# Patient Record
Sex: Female | Born: 1985 | Race: White | Hispanic: No | Marital: Married | Smoking: Never smoker
Health system: Southern US, Community
[De-identification: ages and names within clinical notes are randomized; demographics above are authoritative.]

## PROBLEM LIST (undated history)

## (undated) DIAGNOSIS — R011 Cardiac murmur, unspecified: Secondary | ICD-10-CM

## (undated) DIAGNOSIS — M940 Chondrocostal junction syndrome [Tietze]: Secondary | ICD-10-CM

## (undated) DIAGNOSIS — E739 Lactose intolerance, unspecified: Secondary | ICD-10-CM

## (undated) DIAGNOSIS — B019 Varicella without complication: Secondary | ICD-10-CM

## (undated) DIAGNOSIS — M810 Age-related osteoporosis without current pathological fracture: Secondary | ICD-10-CM

## (undated) DIAGNOSIS — S32000S Wedge compression fracture of unspecified lumbar vertebra, sequela: Secondary | ICD-10-CM

## (undated) DIAGNOSIS — M533 Sacrococcygeal disorders, not elsewhere classified: Secondary | ICD-10-CM

## (undated) DIAGNOSIS — L409 Psoriasis, unspecified: Secondary | ICD-10-CM

## (undated) HISTORY — DX: Chondrocostal junction syndrome (tietze): M94.0

## (undated) HISTORY — DX: Sacrococcygeal disorders, not elsewhere classified: M53.3

## (undated) HISTORY — DX: Cardiac murmur, unspecified: R01.1

## (undated) HISTORY — DX: Lactose intolerance, unspecified: E73.9

## (undated) HISTORY — PX: KNEE ARTHROSCOPY: SHX127

## (undated) HISTORY — DX: Wedge compression fracture of unspecified lumbar vertebra, sequela: S32.000S

## (undated) HISTORY — DX: Varicella without complication: B01.9

## (undated) HISTORY — DX: Psoriasis, unspecified: L40.9

## (undated) HISTORY — DX: Age-related osteoporosis without current pathological fracture: M81.0

---
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
## 2017-04-19 ENCOUNTER — Encounter: Payer: Self-pay | Admitting: Obstetrics and Gynecology

## 2017-05-10 LAB — TSH: TSH: 1.43 (ref 0.41–5.90)

## 2017-05-10 LAB — LIPID PANEL
Cholesterol: 160 (ref 0–200)
HDL: 65 (ref 35–70)
LDL Cholesterol: 82
Triglycerides: 60 (ref 40–160)

## 2017-05-10 LAB — BASIC METABOLIC PANEL
BUN: 5 (ref 4–21)
Creatinine: 0.7 (ref 0.5–1.1)
Glucose: 87
Potassium: 3.5 (ref 3.4–5.3)
Sodium: 140 (ref 137–147)

## 2017-05-10 LAB — HEPATIC FUNCTION PANEL
ALT: 14 (ref 7–35)
AST: 13 (ref 13–35)

## 2017-05-10 LAB — VITAMIN D 25 HYDROXY (VIT D DEFICIENCY, FRACTURES): Vit D, 25-Hydroxy: 27.7

## 2017-06-29 ENCOUNTER — Encounter: Payer: Self-pay | Admitting: *Deleted

## 2017-07-05 LAB — HM DEXA SCAN

## 2017-07-10 NOTE — Progress Notes (Signed)
[name] [place], [place] [address] Phone: [phone] Subjective:     CC: back pain  [id number]  Allison [name] is a 31 y.o. female coming in with complaint of back pain. On 04/04/2017 patient unfortunately was wake boarding and had a audible pop. At that time significant pain and some paresthesias down her legs. Found to have an L3 compression fracture. This is confirmed by CT scan.these were independently visualized by me.patient for the last 2 months have been undergoing formal physical therapy.She had her pelvis realigned which helped alleviate her pain. She is also doing muscle energy for sacral torsion which has alleviated her pain as well. Vertebrae body height has increased from 26 to 29 based on an appointment with a neurosurgeon today.  Patient was released.  Onset- May 2018 Location- right sacral region Duration- intermittent  Character- Aching sensation with significant tightness Aggravating factors- Reliving factors- physical therapy Therapies tried-  Severity-   most recent imaging includes an MRI of the spine taken 05/20/2017. Shows the patient's compression fracture is stabilized with no further progression.  No past medical history on file. No past surgical history on file. Social History   Social History  . Marital status: Married    Spouse name: N/A  . Number of children: N/A  . Years of education: N/A   Social History Main Topics  . Smoking status: Not on file  . Smokeless tobacco: Not on file  . Alcohol use Not on file  . Drug use: Unknown  . Sexual activity: Not on file   Other Topics Concern  . Not on file   Social History Narrative  . No narrative on file   Allergies not on file No family history on file. No family history of autoimmune diseases   Past medical history, social, surgical and family history all reviewed in electronic medical record.  No pertanent information unless stated  regarding to the chief complaint.   Review of Systems:Review of systems updated and as accurate as of 07/12/17  No headache, visual changes, nausea, vomiting, diarrhea, constipation, dizziness, abdominal pain, skin rash, fevers, chills, night sweats, weight loss, swollen lymph nodes, body aches, joint swelling,  chest pain, shortness of breath, mood changes. Positive muscle soreness  Objective  Blood pressure 112/78, pulse 72, weight 120 lb (54.4 kg). Systems examined below as of 07/12/17   General: No apparent distress alert and oriented x3 mood and affect normal, dressed appropriately.  HEENT: Pupils equal, extraocular movements intact  Respiratory: Patient's speak in full sentences and does not appear short of breath  Cardiovascular: No lower extremity edema, non tender, no erythema  Skin: Warm dry intact with no signs of infection or rash on extremities or on axial skeleton.  Abdomen: Soft nontender  Neuro: Cranial nerves II through XII are intact, neurovascularly intact in all extremities with 2+ DTRs and 2+ pulses.  Lymph: No lymphadenopathy of posterior or anterior cervical chain or axillae bilaterally.  Gait normal with good balance and coordination.  MSK:  Non tender with full range of motion and good stability and symmetric strength and tone of shoulders, elbows, wrist, hip, knee and ankles bilaterally.  Back Exam:  Inspection: Loss of lordosis Motion: Flexion 30 deg, Extension 25 deg, Side Bending to 35 deg bilaterally,  Rotation to 35 deg bilaterally  SLR laying: Negative  XSLR laying: Negative  Palpable tenderness: Tenderness over the sacroiliac joint bilaterally as well as the pubic symphysis severely. Mild tightness and pain  in the. FABER: Tightness bilaterally. Sensory change: Gross sensation intact to all lumbar and sacral dermatomes.  Reflexes: 2+ at both patellar tendons, 2+ at achilles tendons, Babinski's downgoing.  Strength at foot  Plantar-flexion: 5/5  Dorsi-flexion: 5/5 Eversion: 5/5 Inversion: 5/5  Leg strength  Quad: 5/5 Hamstring: 5/5 Hip flexor: 5/5 Hip abductors: 5/5  Gait unremarkable.  Osteopathic findings C2 flexed rotated and side bent right C7 flexed rotated and side bent left T3 extended rotated and side bent left inhaled third rib T9 extended rotated and side bent left L3 flexed rotated and side bent right Sacrum right on right Pelvic shear noted  Procedure note 12751; 15 additional minutes spent for Therapeutic exercises as stated in above notes.  This included exercises focusing on stretching, strengthening, with significant focus on eccentric aspects.   Long term goals include an improvement in range of motion, strength, endurance as well as avoiding reinjury. Patient's frequency would include in 1-2 times a day, 3-5 times a week for a duration of 6-12 weeks. Sacroiliac Joint Mobilization and Rehab 1. Work on pretzel stretching, shoulder back and leg draped in front. 3-5 sets, 30 sec.. 2. hip abductor rotations. standing, hip flexion and rotation outward then inward. 3 sets, 15 reps. when can do comfortably, add ankle weights starting at 2 pounds.  3. cross over stretching - shoulder back to ground, same side leg crossover. 3-5 sets for 30 min..  4. rolling up and back knees to chest and rocking. 5. sacral tilt - 5 sets, hold for 5-10 seconds  Proper technique shown and discussed handout in great detail with ATC.  All questions were discussed and answered.     Impression and Recommendations:     This case required medical decision making of moderate complexity.      Note: This dictation was prepared with Dragon dictation along with smaller phrase technology. Any transcriptional errors that result from this process are unintentional.

## 2017-07-12 ENCOUNTER — Ambulatory Visit (INDEPENDENT_AMBULATORY_CARE_PROVIDER_SITE_OTHER): Payer: 59 | Admitting: Family Medicine

## 2017-07-12 ENCOUNTER — Encounter: Payer: Self-pay | Admitting: Family Medicine

## 2017-07-12 DIAGNOSIS — M533 Sacrococcygeal disorders, not elsewhere classified: Secondary | ICD-10-CM

## 2017-07-12 DIAGNOSIS — M999 Biomechanical lesion, unspecified: Secondary | ICD-10-CM | POA: Diagnosis not present

## 2017-07-12 HISTORY — DX: Sacrococcygeal disorders, not elsewhere classified: M53.3

## 2017-07-12 MED ORDER — DICLOFENAC SODIUM 2 % TD SOLN: 2.0000 "application " | Freq: Two times a day (BID) | TRANSDERMAL | 3 refills | Status: DC

## 2017-07-12 MED ORDER — VITAMIN D (ERGOCALCIFEROL) 1.25 MG (50000 UNIT) PO CAPS: 50000.0000 [IU] | ORAL_CAPSULE | ORAL | 0 refills | Status: DC

## 2017-07-12 MED ORDER — PREDNISONE 50 MG PO TABS: 50.0000 mg | ORAL_TABLET | Freq: Every day | ORAL | 0 refills | Status: DC

## 2017-07-12 NOTE — Assessment & Plan Note (Signed)
Decision today to treat with OMT was based on Physical Exam  After verbal consent patient was treated with HVLA, ME, FPR techniques in cervical, thoracic, lumbar and sacral and pelvis areas  Patient tolerated the procedure well with improvement in symptoms  Patient given exercises, stretches and lifestyle modifications  See medications in patient instructions if given  Patient will follow up in 3 weeks

## 2017-07-12 NOTE — Patient Instructions (Addendum)
Good to see you  Ice 20 minutes 2 times daily. Usually after activity and before bed on the sacral area or pubic bone Once weekly vitamin D for 12 weeks.  Prednisone daily for 5 days if you feel not making significant improvement this week Exercises 3 times a week.  Take handout to PT as well.  pennsaid pinkie amount topically 2 times daily as needed.   It will take some time but I will work as hard as you  Over the counter Turmeric 500mg  twice daily  Tart cherry extract any dose at night  See me again in 3 weeks.

## 2017-07-12 NOTE — Assessment & Plan Note (Signed)
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 Patient also has significant inflammation of the pubic synthesis in the SI joints bilaterally. Laboratory workup if this continues. We discussed the possibility of prednisone which patient will consider. Started on once weekly vitamin D as well as prednisone. We discussed which activities doing which ones to avoid. She'll start to increase activity as swelling. Patient will follow-up with me again in 3-4 weeks. Responded fairly well to osteopathic manipulation today.

## 2017-07-23 ENCOUNTER — Encounter: Payer: Self-pay | Admitting: Family Medicine

## 2017-07-25 ENCOUNTER — Encounter: Payer: Self-pay | Admitting: Family Medicine

## 2017-07-26 NOTE — Progress Notes (Signed)
[name] [address], [place] [address] Phone: [phone] Subjective:    I'm seeing this patient by the request  of:    CC: Low back pain follow-up  [id number]  Allison [name] is a 31 y.o. female coming in with complaint of low back pain. Patient was seen by me and did have a history of a compression fracture of the back. Was having more pain over the sacroiliac joint. Since of the pain is severe. Attempted osteopathic manipulation and patient was also given prednisone. Patient was having significant pain still and is here early.  Patient states that she did not get any significant improvement from the osteopathic manipulation. Feels that the tightness in the back is worsening. Most the pain seems to be on the flank of the right side now. Seems to be a constant dull aching sensation that is worse with certain movements. Mild intermittent pain going down the right leg. Patient states that she has even noticed when she lifts her left leg she has more pain going down the right leg. Patient denies any bowel or bladder difficulties but sometimes feels like she is having more urinary urgency recently. Patient is concerned because any other treatments that she has tried at this point has not been helping.   previous imaging did show a subacute L3 and superior endplate fracture and then seemed to being stabilizing with 15% height loss. Patient also found to have a 3.1 cm cystic lesion of the right upper renal hole containing calcified debris it seemed to be calcium  No past medical history on file. No past surgical history on file. Social History   Social History  . Marital status: Married    Spouse name: N/A  . Number of children: N/A  . Years of education: N/A   Social History Main Topics  . Smoking status: Never Smoker  . Smokeless tobacco: Never Used  . Alcohol use None  . Drug use: Unknown  . Sexual activity: Not Asked   Other Topics  Concern  . None   Social History Narrative  . None   Not on File No family history on file. No family history of autoimmune diseases   Past medical history, social, surgical and family history all reviewed in electronic medical record.  No pertanent information unless stated regarding to the chief complaint.   Review of Systems:Review of systems updated and as accurate as of 07/27/17  No headache, visual changes, nausea, vomiting, diarrhea, constipation, dizziness, abdominal pain, skin rash, fevers, chills, night sweats, weight loss, swollen lymph nodes, body aches, joint swelling, chest pain, shortness of breath, mood changes. Positive muscle aches  Objective  Blood pressure 104/78, pulse (!) 102, height 5\' 5"  (1.651 m), weight 121 lb (54.9 kg), SpO2 98 %. Systems examined below as of 07/27/17   General: No apparent distress alert and oriented x3 mood and affect normal, dressed appropriately.  HEENT: Pupils equal, extraocular movements intact  Respiratory: Patient's speak in full sentences and does not appear short of breath  Cardiovascular: No lower extremity edema, non tender, no erythema  Skin: Warm dry intact with no signs of infection or rash on extremities or on axial skeleton.  Abdomen: Soft nontender  Neuro: Cranial nerves II through XII are intact, neurovascularly intact in all extremities with 2+ DTRs and 2+ pulses.  Lymph: No lymphadenopathy of posterior or anterior cervical chain or axillae bilaterally.  Gait normal with good balance and coordination.  MSK:  Non tender  with full range of motion and good stability and symmetric strength and tone of shoulders, elbows, wrist, hip, knee and ankles bilaterally.  Back Exam:  Inspection: Mild loss of lordosis Motion: Flexion 35 deg with mild worsening pain, Extension 15 deg, Side Bending to 25 deg bilaterally,  Rotation to 35 deg bilaterally  SLR laying: Worsening tightness on the right side at 20 with mild tightness of the  hamstring. XSLR laying: Positive on the right as well Palpable tenderness: Tender to palpation in the paraspinal musculature more on the right side and flank side on the right. FABER: negative. Sensory change: Gross sensation intact to all lumbar and sacral dermatomes.  Reflexes: 2+ at both patellar tendons, 2+ at achilles tendons, Babinski's downgoing.  Strength at foot  Plantar-flexion: 5/5 Dorsi-flexion: 5/5 Eversion: 5/5 Inversion: 5/5  Leg strength  Quad: 5/5 Hamstring: 5/5 Hip flexor: 5/5 Hip abductors: 5/5  Gait unremarkable.     Impression and Recommendations:     This case required medical decision making of moderate complexity.      Note: This dictation was prepared with Dragon dictation along with smaller phrase technology. Any transcriptional errors that result from this process are unintentional.

## 2017-07-27 ENCOUNTER — Encounter: Payer: Self-pay | Admitting: Family Medicine

## 2017-07-27 ENCOUNTER — Ambulatory Visit (INDEPENDENT_AMBULATORY_CARE_PROVIDER_SITE_OTHER): Payer: 59 | Admitting: Family Medicine

## 2017-07-27 ENCOUNTER — Other Ambulatory Visit (INDEPENDENT_AMBULATORY_CARE_PROVIDER_SITE_OTHER): Payer: 59

## 2017-07-27 VITALS — BP 104/78 | HR 102 | Ht 65.0 in | Wt 121.0 lb

## 2017-07-27 DIAGNOSIS — M533 Sacrococcygeal disorders, not elsewhere classified: Secondary | ICD-10-CM

## 2017-07-27 DIAGNOSIS — M810 Age-related osteoporosis without current pathological fracture: Secondary | ICD-10-CM

## 2017-07-27 DIAGNOSIS — N2889 Other specified disorders of kidney and ureter: Secondary | ICD-10-CM

## 2017-07-27 DIAGNOSIS — M2550 Pain in unspecified joint: Secondary | ICD-10-CM

## 2017-07-27 HISTORY — DX: Age-related osteoporosis without current pathological fracture: M81.0

## 2017-07-27 LAB — COMPREHENSIVE METABOLIC PANEL
ALT: 15 U/L (ref 0–35)
AST: 13 U/L (ref 0–37)
Albumin: 4.3 g/dL (ref 3.5–5.2)
Alkaline Phosphatase: 52 U/L (ref 39–117)
BUN: 9 mg/dL (ref 6–23)
CO2: 24 mEq/L (ref 19–32)
Calcium: 9.3 mg/dL (ref 8.4–10.5)
Chloride: 104 mEq/L (ref 96–112)
Creatinine, Ser: 0.84 mg/dL (ref 0.40–1.20)
GFR: 84.53 mL/min (ref >60.00)
Glucose, Bld: 99 mg/dL (ref 70–99)
Potassium: 3.7 mEq/L (ref 3.5–5.1)
Sodium: 139 mEq/L (ref 135–145)
Total Bilirubin: 0.4 mg/dL (ref 0.2–1.2)
Total Protein: 6.7 g/dL (ref 6.0–8.3)

## 2017-07-27 LAB — CBC WITH DIFFERENTIAL/PLATELET
Basophils Absolute: 0.1 10*3/uL (ref 0.0–0.1)
Basophils Relative: 1.1 % (ref 0.0–3.0)
Eosinophils Absolute: 0.2 10*3/uL (ref 0.0–0.7)
Eosinophils Relative: 2.6 % (ref 0.0–5.0)
HCT: 42.1 % (ref 36.0–46.0)
Hemoglobin: 14 g/dL (ref 12.0–15.0)
Lymphocytes Relative: 21.7 % (ref 12.0–46.0)
Lymphs Abs: 1.3 10*3/uL (ref 0.7–4.0)
MCHC: 33.2 g/dL (ref 30.0–36.0)
MCV: 86.9 fl (ref 78.0–100.0)
Monocytes Absolute: 0.4 10*3/uL (ref 0.1–1.0)
Monocytes Relative: 7.2 % (ref 3.0–12.0)
Neutro Abs: 4.2 10*3/uL (ref 1.4–7.7)
Neutrophils Relative %: 67.4 % (ref 43.0–77.0)
Platelets: 244 10*3/uL (ref 150.0–400.0)
RBC: 4.85 Mil/uL (ref 3.87–5.11)
RDW: 14.1 % (ref 11.5–15.5)
WBC: 6.2 10*3/uL (ref 4.0–10.5)

## 2017-07-27 LAB — SEDIMENTATION RATE: Sed Rate: 1 mm/hr (ref 0–20)

## 2017-07-27 LAB — TESTOSTERONE: Testosterone: 51.28 ng/dL — ABNORMAL HIGH (ref 15.00–40.00)

## 2017-07-27 LAB — TSH: TSH: 1.98 u[IU]/mL (ref 0.35–4.50)

## 2017-07-27 LAB — C-REACTIVE PROTEIN: CRP: <0.1 mg/dL — ABNORMAL LOW (ref 0.5–20.0)

## 2017-07-27 LAB — VITAMIN D 25 HYDROXY (VIT D DEFICIENCY, FRACTURES): VITD: 40.58 ng/mL (ref 30.00–100.00)

## 2017-07-27 LAB — IBC PANEL
Iron: 49 ug/dL (ref 42–145)
Saturation Ratios: 10.3 % — ABNORMAL LOW (ref 20.0–50.0)
Transferrin: 341 mg/dL (ref 212.0–360.0)

## 2017-07-27 LAB — URIC ACID: Uric Acid, Serum: 3.8 mg/dL (ref 2.4–7.0)

## 2017-07-27 NOTE — Patient Instructions (Signed)
Good to see you  We will get labs today  ALso order the MRI we discussed.  Endocrinology is a good idea.  I will like to see you 1-2 days after the scans and we will discuss.

## 2017-07-27 NOTE — Assessment & Plan Note (Signed)
Patient does have more of a renal mass on the right side. Patient has had difficulty with the back pain and it seems to be worsening. Patient is having more flank pain in the vicinity of the kidney as well. Has never passed a any stone previously. Patient going back through her imaging and revealing has had a small calcium deposit noted in 2011 but it has increased in size to 3.1 cm. I do feel that further evaluation with patient not improving with any of the conservative therapy would be beneficial at this point. MRI abdomen with and without contrast and MRI pelvis. Patient continues to have pain over the sacrum as well as we will rule out any other occult fracture. I do feel that laboratory workup is also necessary to rule out any autoimmune disease or any other organic pathology that can cause the pain. Condition's on the type of cancer etiology. Likely more of a calcific change but could be increasing in size and causing discomfort

## 2017-07-27 NOTE — Assessment & Plan Note (Signed)
Awaiting endocrinology.  Could contribute to occult fracture.

## 2017-07-27 NOTE — Assessment & Plan Note (Signed)
Continues to have significant amount pain and this time. Patient has had advance imaging of the superior aspect of the sacrum that was independently visualized again by me. No significant bony abnormality or inflammatory aspect but there is a possibility for an occult fracture lower. I would like to have a MRI of the pelvis done. Patient also has osteoporosis a young age and is seen endocrinology. Likely entrapment as well.

## 2017-07-31 LAB — PTH, INTACT AND CALCIUM
Calcium: 9.3 mg/dL (ref 8.6–10.2)
PTH: 25 pg/mL (ref 14–64)

## 2017-07-31 LAB — ANTI-NUCLEAR AB-TITER (ANA TITER): ANA Titer 1: 1:320 {titer} — ABNORMAL HIGH

## 2017-07-31 LAB — ANGIOTENSIN CONVERTING ENZYME: Angiotensin-Converting Enzyme: 39 U/L (ref 9–67)

## 2017-07-31 LAB — ANA: Anti Nuclear Antibody(ANA): POSITIVE — AB

## 2017-07-31 LAB — ESTROGENS, TOTAL: Estrogen: 228.9 pg/mL

## 2017-07-31 LAB — CYCLIC CITRUL PEPTIDE ANTIBODY, IGG: Cyclic Citrullin Peptide Ab: <16 UNITS

## 2017-07-31 LAB — CALCIUM, IONIZED: Calcium, Ion: 5 mg/dL (ref 4.8–5.6)

## 2017-07-31 LAB — RHEUMATOID FACTOR: Rhuematoid fact SerPl-aCnc: <14 IU/mL (ref <14)

## 2017-08-08 ENCOUNTER — Ambulatory Visit
Admission: RE | Admit: 2017-08-08 | Discharge: 2017-08-08 | Disposition: A | Discharge status: Home / Self Care | Payer: 59 | Source: Ambulatory Visit | Attending: Family Medicine | Admitting: Family Medicine

## 2017-08-08 DIAGNOSIS — N2889 Other specified disorders of kidney and ureter: Secondary | ICD-10-CM

## 2017-08-08 MED ORDER — GADOBENATE DIMEGLUMINE 529 MG/ML IV SOLN
12.0000 mL | Freq: Once | INTRAVENOUS | Status: AC | PRN
  Administered 2017-08-08: 12 mL via INTRAVENOUS

## 2017-08-09 ENCOUNTER — Ambulatory Visit: Payer: Self-pay

## 2017-08-09 ENCOUNTER — Ambulatory Visit (INDEPENDENT_AMBULATORY_CARE_PROVIDER_SITE_OTHER): Payer: 59 | Admitting: Family Medicine

## 2017-08-09 ENCOUNTER — Encounter: Payer: Self-pay | Admitting: Family Medicine

## 2017-08-09 VITALS — BP 108/78 | HR 105 | Wt 120.0 lb

## 2017-08-09 DIAGNOSIS — N2889 Other specified disorders of kidney and ureter: Secondary | ICD-10-CM | POA: Diagnosis not present

## 2017-08-09 DIAGNOSIS — M533 Sacrococcygeal disorders, not elsewhere classified: Secondary | ICD-10-CM

## 2017-08-09 DIAGNOSIS — R768 Other specified abnormal immunological findings in serum: Secondary | ICD-10-CM | POA: Diagnosis not present

## 2017-08-09 NOTE — Progress Notes (Signed)
[name] [address] [place], [place] [address] Phone: [phone] Subjective:    I'm seeing this patient by the request  of:    CC: Body pain [id number]  Allison [name] is a 31 y.o. female coming in for follow up from her MRI. Patient did have a renal cyst and was further evaluated with MRI of the abdominal and pelvis area. This was independently visualized by me and shows a simple benign renal cyst bilaterally actually. This is not seem to be the cause of patient's pain. Patient though does have pain so on the right sacroiliac joint. Patient states that it seems to be more localized here. Patient has seen a chiropractor recently and has noticed some mild improvement. Feels the trigger point injections that she was given from an outside facility has helped as well.      No past medical history on file. No past surgical history on file. Social History   Social History  . Marital status: Married    Spouse name: N/A  . Number of children: N/A  . Years of education: N/A   Social History Main Topics  . Smoking status: Never Smoker  . Smokeless tobacco: Never Used  . Alcohol use Not on file  . Drug use: Unknown  . Sexual activity: Not on file   Other Topics Concern  . Not on file   Social History Narrative  . No narrative on file   Not on File No family history on file.   Past medical history, social, surgical and family history all reviewed in electronic medical record.  No pertanent information unless stated regarding to the chief complaint.   Review of Systems:Review of systems updated and as accurate as of 08/09/17  No headache, visual changes, nausea, vomiting, diarrhea, constipation, dizziness, abdominal pain, skin rash, fevers, chills, night sweats, weight loss, swollen lymph nodes, body aches, joint swelling,  chest pain, shortness of breath, mood changes. Positive muscle aches  Objective  There were no vitals taken for this  visit. Systems examined below as of 08/09/17   General: No apparent distress alert and oriented x3 mood and affect normal, dressed appropriately.  HEENT: Pupils equal, extraocular movements intact  Respiratory: Patient's speak in full sentences and does not appear short of breath  Cardiovascular: No lower extremity edema, non tender, no erythema  Skin: Warm dry intact with no signs of infection or rash on extremities or on axial skeleton.  Abdomen: Soft nontender  Neuro: Cranial nerves II through XII are intact, neurovascularly intact in all extremities with 2+ DTRs and 2+ pulses.  Lymph: No lymphadenopathy of posterior or anterior cervical chain or axillae bilaterally.  Gait normal with good balance and coordination.  MSK:  Non tender with full range of motion and good stability and symmetric strength and tone of shoulders, elbows, wrist, hip, knee and ankles bilaterally.  Back Exam:  Inspection: Mild loss of lordosis Motion: Flexion 45 deg, Extension 15 deg, Side Bending to 45 deg bilaterally,  Rotation to 25 deg bilaterally  SLR laying: Negative  XSLR laying: Negative  Palpable tenderness: Worsening pain with extension and pain over the right sacroiliac joint. FABER: Positive right. Sensory change: Gross sensation intact to all lumbar and sacral dermatomes.  Reflexes: 2+ at both patellar tendons, 2+ at achilles tendons, Babinski's downgoing.  Strength at foot  Plantar-flexion: 5/5 Dorsi-flexion: 5/5 Eversion: 5/5 Inversion: 5/5  Leg strength  Quad: 5/5 Hamstring: 5/5 Hip flexor: 5/5 Hip abductors: 5/5  Gait  unremarkable.    Procedure: Real-time Ultrasound Guided Injection of right sacroiliac joint Device: GE Logiq Q7 Ultrasound guided injection is preferred based studies that show increased duration, increased effect, greater accuracy, decreased procedural pain, increased response rate, and decreased cost with ultrasound guided versus blind injection.  Verbal informed consent  obtained.  Time-out conducted.  Noted no overlying erythema, induration, or other signs of local infection.  Skin prepped in a sterile fashion.  Local anesthesia: Topical Ethyl chloride.  With sterile technique and under real time ultrasound guidance:  With a 21-gauge 2 and she'll patient was injected with a total of 0.5 mL of 0.5% Marcaine and 0.5 mL of Kenalog 40 mg/dL into the right sacroiliac joint. Completed without difficulty  Pain immediately resolved suggesting accurate placement of the medication.  Advised to call if fevers/chills, erythema, induration, drainage, or persistent bleeding.  Images permanently stored and available for review in the ultrasound unit.  Impression: Technically successful ultrasound guided injection. Impression and Recommendations:     This case required medical decision making of moderate complexity.      Note: This dictation was prepared with Dragon dictation along with smaller phrase technology. Any transcriptional errors that result from this process are unintentional.

## 2017-08-09 NOTE — Assessment & Plan Note (Signed)
Patient given injection. Hopefully this will be beneficial. Discussed continuing the other conservative therapy at this time. Positive ANA and I do think that referral to rheumatology is worthwhile. Patient will continue with manipulation as well. Follow-up with me again in 4 weeks

## 2017-08-09 NOTE — Patient Instructions (Signed)
Good to see you  I will get you in with rheumatology  Ice is your friend.  Stay active We tried an injection for the SI joint and I hope it helps.  Lets continue the other medicines  Need to recheck the vitamin D in another 3 months.  I would like to see you again in 4 weeks to make sure SI joint is better and send me a message in 2 weeks. \

## 2017-08-23 ENCOUNTER — Encounter: Payer: Self-pay | Admitting: Family Medicine

## 2017-09-06 ENCOUNTER — Ambulatory Visit (INDEPENDENT_AMBULATORY_CARE_PROVIDER_SITE_OTHER): Payer: 59 | Admitting: Family Medicine

## 2017-09-06 ENCOUNTER — Encounter: Payer: Self-pay | Admitting: Family Medicine

## 2017-09-06 VITALS — BP 110/74 | HR 86 | Ht 65.0 in | Wt 121.0 lb

## 2017-09-06 DIAGNOSIS — M533 Sacrococcygeal disorders, not elsewhere classified: Secondary | ICD-10-CM | POA: Diagnosis not present

## 2017-09-06 DIAGNOSIS — M999 Biomechanical lesion, unspecified: Secondary | ICD-10-CM

## 2017-09-06 NOTE — Patient Instructions (Signed)
Good to see you  You are doing great  I am glad the injection helped Have a great trip  Keep it up! See me again in 3 weeks if travel is hard otherwise 4-5 weeks!

## 2017-09-06 NOTE — Assessment & Plan Note (Signed)
Decision today to treat with OMT was based on Physical Exam  After verbal consent patient was treated with HVLA, ME, FPR techniques in cervical, thoracic, rib lumbar and sacral areas  Patient tolerated the procedure well with improvement in symptoms  Patient given exercises, stretches and lifestyle modifications  See medications in patient instructions if given  Patient will follow up in 4-6 weeks 

## 2017-09-06 NOTE — Progress Notes (Signed)
[name] [address] [place], [place] [address] Phone: [phone] Subjective:    I'm seeing this patient by the request  of:    CC: Low back pain follow-up  [id number]  Allison [name] is a 31 y.o. female coming in with complaint of low back pain. 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 Did have a positive ANA and mild low iron. Patient is making significant improvement. Patient states that the pain is significantly less. No radiation of pain. Feels the injection in the SI joint has helped. Endocrinology has not made any significant difference. Patient is seen rheumatology next month. Patient is happy because this is the largest chain she is seen in multiple months if not years      No past medical history on file. No past surgical history on file. Social History   Social History  . Marital status: Married    Spouse name: N/A  . Number of children: N/A  . Years of education: N/A   Social History Main Topics  . Smoking status: Never Smoker  . Smokeless tobacco: Never Used  . Alcohol use None  . Drug use: Unknown  . Sexual activity: Not Asked   Other Topics Concern  . None   Social History Narrative  . None   Not on File No family history on file. No family history of autoimmune diseases   Past medical history, social, surgical and family history all reviewed in electronic medical record.  No pertanent information unless stated regarding to the chief complaint.   Review of Systems:Review of systems updated and as accurate as of 09/06/17  No headache, visual changes, nausea, vomiting, diarrhea, constipation, dizziness, abdominal pain, skin rash, fevers, chills, night sweats, weight loss, swollen lymph nodes, body aches, joint swelling,  chest pain, shortness of breath, mood changes. Positive muscle aches  Objective  Blood pressure 110/74, pulse 86, height 5\' 5"  (1.651 m), weight 121 lb (54.9 kg), SpO2 96 %. Systems examined  below as of 09/06/17   General: No apparent distress alert and oriented x3 mood and affect normal, dressed appropriately.  HEENT: Pupils equal, extraocular movements intact  Respiratory: Patient's speak in full sentences and does not appear short of breath  Cardiovascular: No lower extremity edema, non tender, no erythema  Skin: Warm dry intact with no signs of infection or rash on extremities or on axial skeleton.  Abdomen: Soft nontender  Neuro: Cranial nerves II through XII are intact, neurovascularly intact in all extremities with 2+ DTRs and 2+ pulses.  Lymph: No lymphadenopathy of posterior or anterior cervical chain or axillae bilaterally.  Gait normal with good balance and coordination.  MSK:  Non tender with full range of motion and good stability and symmetric strength and tone of shoulders, elbows, wrist, hip, knee and ankles bilaterally.  Back Exam:  Inspection: Unremarkable  Motion: Flexion 45 deg, Extension 25 deg, Side Bending to 35 deg bilaterally,  Rotation to 45 deg bilaterally  SLR laying: Negative  XSLR laying: Negative  Palpable tenderness: Tender to palpation in the paraspinal musculature more around the right sacroiliac joint.Marland Kitchen FABER: negative. Sensory change: Gross sensation intact to all lumbar and sacral dermatomes.  Reflexes: 2+ at both patellar tendons, 2+ at achilles tendons, Babinski's downgoing.  Strength at foot  Plantar-flexion: 5/5 Dorsi-flexion: 5/5 Eversion: 5/5 Inversion: 5/5  Leg strength  Quad: 5/5 Hamstring: 5/5 Hip flexor: 5/5 Hip abductors: 4/5  Gait unremarkable.  Osteopathic findings T3 extended rotated and side bent right inhaled  third rib T6 extended rotated and side bent left L2 flexed rotated and side bent right Sacrum right on right \\pelvic  shear      Impression and Recommendations:     This case required medical decision making of moderate complexity.      Note: This dictation was prepared with Dragon dictation along with  smaller phrase technology. Any transcriptional errors that result from this process are unintentional.

## 2017-09-06 NOTE — Assessment & Plan Note (Signed)
SI joint. Has done well with the injection. Feeling much better overall. Continue the once weekly vitamin D. Did not feel that the topical anti-inflammatory was helping. Follow-up with me again in 4-6 weeks

## 2017-09-27 ENCOUNTER — Ambulatory Visit: Payer: Self-pay

## 2017-09-27 ENCOUNTER — Encounter: Payer: Self-pay | Admitting: Family Medicine

## 2017-09-27 ENCOUNTER — Ambulatory Visit (INDEPENDENT_AMBULATORY_CARE_PROVIDER_SITE_OTHER): Payer: 59 | Admitting: Family Medicine

## 2017-09-27 VITALS — BP 120/70 | HR 92 | Wt 127.0 lb

## 2017-09-27 DIAGNOSIS — M533 Sacrococcygeal disorders, not elsewhere classified: Secondary | ICD-10-CM | POA: Diagnosis not present

## 2017-09-27 DIAGNOSIS — M25552 Pain in left hip: Secondary | ICD-10-CM

## 2017-09-27 NOTE — Assessment & Plan Note (Signed)
Patient given injection.  Tolerated the procedure well..  Patient is following up with other specialists with her history of compression fracture and osteoporosis.  Positive ANA.  Patient does not want to make any other medication changes.  Following up in 3-4 weeks.

## 2017-09-27 NOTE — Patient Instructions (Signed)
Good to see you  Allison [name] is your friend.  I think this should help the SI joint.  Interested to see what rheum says Switch to the ball at work See what PT says.  I still think you are doing amazing Continue the vitamin D See me again in 3-4 weeks

## 2017-09-27 NOTE — Progress Notes (Signed)
[name] [place] [address], [place] [address] Phone: [phone] Subjective:    I'm seeing this patient by the request  of:    CC: Low back pain follow-up  [id number]  Allison [name] is a 31 y.o. female coming in with complaint of low back pain. 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 Did have a positive ANA and mild low iron. Patient is making significant improvement.  Patient was also found to have osteoporosis.  Patient is trying the once weekly vitamin D and bisphosphonates at that time.  Patient has a office visit with rheumatology.  Did respond well to a right-sided sacroiliac joint previously and now is having more of a left-sided pain.  States that she would like to potentially try this.     No past medical history on file. No past surgical history on file. Social History   Socioeconomic History  . Marital status: Married    Spouse name: None  . Number of children: None  . Years of education: None  . Highest education level: None  Social Needs  . Financial resource strain: None  . Food insecurity - worry: None  . Food insecurity - inability: None  . Transportation needs - medical: None  . Transportation needs - non-medical: None  Occupational History  . None  Tobacco Use  . Smoking status: Never Smoker  . Smokeless tobacco: Never Used  Substance and Sexual Activity  . Alcohol use: None  . Drug use: None  . Sexual activity: None  Other Topics Concern  . None  Social History Narrative  . None   Not on File No family history on file. No family history of autoimmune diseases   Past medical history, social, surgical and family history all reviewed in electronic medical record.  No pertanent information unless stated regarding to the chief complaint.   Review of Systems: No headache, visual changes, nausea, vomiting, diarrhea, constipation, dizziness, abdominal pain, skin rash, fevers, chills, night sweats, weight loss,  swollen lymph nodes, body aches, joint swelling,, chest pain, shortness of breath, mood changes.  Positive muscle aches  Objective  Blood pressure 120/70, pulse 92, weight 127 lb (57.6 kg), SpO2 99 %. Systems examined below as of 09/27/17   Systems examined below as of 09/27/17 General: NAD A&O x3 mood, affect normal  HEENT: Pupils equal, extraocular movements intact no nystagmus Respiratory: not short of breath at rest or with speaking Cardiovascular: No lower extremity edema, non tender Skin: Warm dry intact with no signs of infection or rash on extremities or on axial skeleton. Abdomen: Soft nontender, no masses Neuro: Cranial nerves  intact, neurovascularly intact in all extremities with 2+ DTRs and 2+ pulses. Lymph: No lymphadenopathy appreciated today  Gait normal with good balance and coordination.  MSK: Non tender with full range of motion and good stability and symmetric strength and tone of shoulders, elbows, wrist,  knee hips and ankles bilaterally.   Back Exam:  Inspection: Unremarkable  Motion: Flexion 35 deg, Extension 15 deg, Side Bending to 45 deg bilaterally,  Rotation to 45 deg bilaterally  SLR laying: Negative  XSLR laying: Negative  Palpable tenderness: Positive left.  Sacroiliac pain FABER: Positive left. Sensory change: Gross sensation intact to all lumbar and sacral dermatomes.  Reflexes: 2+ at both patellar tendons, 2+ at achilles tendons, Babinski's downgoing.  Strength at foot  Plantar-flexion: 5/5 Dorsi-flexion: 5/5 Eversion: 5/5 Inversion: 5/5  Leg strength  Quad: 5/5 Hamstring: 5/5 Hip flexor: 5/5 Hip abductors:  4/5 and symmetric Gait unremarkable.  Procedure: Real-time Ultrasound Guided Injection of left sacroiliac joint Device: GE Logiq Q7 Ultrasound guided injection is preferred based studies that show increased duration, increased effect, greater accuracy, decreased procedural pain, increased response rate, and decreased cost with ultrasound guided  versus blind injection.  Verbal informed consent obtained.  Time-out conducted.  Noted no overlying erythema, induration, or other signs of local infection.  Skin prepped in a sterile fashion.  Local anesthesia: Topical Ethyl chloride.  With sterile technique and under real time ultrasound guidance: A 21-gauge 2 inch needle patient was injected with 0.5 cc of 0.5% Marcaine and 1 cc of Kenalog 40 mg/mL into the left sacroiliac joint. Completed without difficulty  Pain immediately resolved suggesting accurate placement of the medication.  Advised to call if fevers/chills, erythema, induration, drainage, or persistent bleeding.  Images permanently stored and available for review in the ultrasound unit.  Impression: Technically successful ultrasound guided injection.  Impression and Recommendations:     This case required medical decision making of moderate complexity.      Note: This dictation was prepared with Dragon dictation along with smaller phrase technology. Any transcriptional errors that result from this process are unintentional.

## 2017-10-04 ENCOUNTER — Ambulatory Visit: Payer: Managed Care, Other (non HMO) | Attending: Internal Medicine | Admitting: Physical Therapy

## 2017-10-04 ENCOUNTER — Ambulatory Visit: Payer: 59 | Admitting: Family Medicine

## 2017-10-04 ENCOUNTER — Encounter: Payer: Self-pay | Admitting: Physical Therapy

## 2017-10-04 DIAGNOSIS — G8929 Other chronic pain: Secondary | ICD-10-CM | POA: Insufficient documentation

## 2017-10-04 DIAGNOSIS — M545 Low back pain: Secondary | ICD-10-CM | POA: Diagnosis not present

## 2017-10-04 DIAGNOSIS — M6281 Muscle weakness (generalized): Secondary | ICD-10-CM | POA: Insufficient documentation

## 2017-10-05 ENCOUNTER — Encounter: Payer: Self-pay | Admitting: Physical Therapy

## 2017-10-05 NOTE — Therapy (Signed)
[place], [place], [address] Phone: [phone]   Fax:  [phone]  Physical Therapy Evaluation  Patient Details  Name: Allison [name] MRN: [id number] Date of Birth: 1986-10-21 Referring Provider: Dr Berkley Harvey    Encounter Date: 10/04/2017  PT End of Session - 10/05/17 0757    Visit Number  1    Number of Visits  16    Authorization Type  Aetna     PT Start Time  9326    PT Stop Time  1058    PT Time Calculation (min)  43 min    Activity Tolerance  Patient tolerated treatment well    Behavior During Therapy  [facility] for tasks assessed/performed       History reviewed. No pertinent past medical history.  History reviewed. No pertinent surgical history.  There were no vitals filed for this visit.   Subjective Assessment - 10/05/17 0751    Subjective  Patient was wake boarding several month ago when she suffered a lumbar compression fracture. she had PT which helped her lower back pain. It was found that shehad beggining osteoperosis. The MD sent her to therapy to work onan osteoperosis program. To prevent further bone density loss and main tain posture. She has been working on her HEP for her back at home. She has had injections in her SI recently which have significantly improved her pain.     Limitations  Standing;Walking;Lifting    Currently in Pain?  Yes    Pain Score  3     Pain Location  Sacrum    Pain Orientation  Mid    Pain Descriptors / Indicators  Aching    Pain Type  Chronic pain    Pain Onset  More than a month ago    Pain Frequency  Constant    Aggravating Factors   sitting on stool     Pain Relieving Factors  rest     Effect of Pain on Daily Activities  difficulty perfroming ADL's     Multiple Pain Sites  No         [facility] PT Assessment - 10/05/17 0001      Assessment   Medical Diagnosis  Osteoperosis; Lumbar disc buldge     Referring Provider  Dr Berkley Harvey     Onset  Date/Surgical Date  10/03/17    Hand Dominance  Right    Next MD Visit  February     Prior Therapy  In August patient had therapy for her back       Precautions   Precautions  None      Restrictions   Weight Bearing Restrictions  No      Balance Screen   Has the patient fallen in the past 6 months  No    Has the patient had a decrease in activity level because of a fear of falling?   No    Is the patient reluctant to leave their home because of a fear of falling?   No      Home Environment   Additional Comments  Lives on the third floor of an apartment complex       Prior Function   Level of Independence  Independent    Vocation  Full time employment    Vocation Requirements  Optomotrist      Leisure  jogging; Spin;       Cognition   Overall Cognitive Status  Within Functional Limits for  tasks assessed    Attention  Focused    Focused Attention  Appears intact    Memory  Appears intact    Awareness  Appears intact    Problem Solving  Appears intact    Executive Function  Reasoning      Sensation   Light Touch  Appears Intact      Coordination   Gross Motor Movements are Fluid and Coordinated  Yes    Fine Motor Movements are Fluid and Coordinated  Yes      AROM   Lumbar Flexion  limited 25%     Lumbar Extension  no pain     Lumbar - Right Side Bend  no pain     Lumbar - Left Side Bend  no pain     Lumbar - Right Rotation  no pain     Lumbar - Left Rotation  no pain       PROM   Overall PROM Comments  Hip PROM WNL       Strength   Right Hip Flexion  4+/5    Right Hip ABduction  4/5    Left Hip Flexion  4+/5    Left Hip ABduction  4/5      Flexibility   Soft Tissue Assessment /Muscle Length  yes    Hamstrings  minor limitation at 90/90       Palpation   Spinal mobility  Normal PA mobility     Palpation comment  Hips appear alligned      Special Tests    Special Tests  Lumbar    Lumbar Tests  Straight Leg Raise      Straight Leg Raise   Comment  (-)        Ambulation/Gait   Gait Comments  Normal gait pattern              Objective measurements completed on examination: See above findings.      [facility] Adult PT Treatment/Exercise - 10/05/17 0001      Shoulder Exercises: Supine   Other Supine Exercises  Scapular series: bilateral ER with abdominal breathing 2x10 yellow;; D2 flexion bilateral 2x10 yellow; horizontal abduction yellow 2x10; supine shoulder flexion yellow 2x10 all with abdominal brace; scap retraction red  2x10;              PT Education - 10/05/17 0756    Education provided  Yes    Education Details  Reviewed HEP; symptom mangement; activity progression     Person(s) Educated  Patient    Methods  Explanation;Demonstration;Tactile cues;Verbal cues    Comprehension  Verbalized understanding;Returned demonstration;Verbal cues required;Tactile cues required       PT Short Term Goals - 10/04/17 1548      PT SHORT TERM GOAL #1   Title  Patient will be indepdent with inital HEP for osteoperosis program     Time  4    Period  Weeks    Status  New    Target Date  11/01/17      PT SHORT TERM GOAL #2   Title  Patient will increase gross bilateral lower extremity strength to 4+/5    Time  4    Period  Weeks    Status  New    Target Date  11/01/17        PT Long Term Goals - 10/04/17 1629      PT LONG TERM GOAL #1   Title  Patient will be independent with complete  program for osteoperosis prevention and maitenence    Time  8    Period  Weeks    Status  New    Target Date  11/29/17      PT LONG TERM GOAL #2   Title  Patient will return to light jogging if cleared by MD to do so    Time  8    Period  Weeks    Status  New    Target Date  11/29/17      PT LONG TERM GOAL #3   Title  Patient will demsotrate  a 24% limitation on FOTO     Time  8    Status  New    Target Date  11/29/17             Plan - 10/04/17 1537    Clinical Impression Statement  Patient is a 31 year old female who  presents to physical therapy for an osteoperosis mangement and prevention program. She was active prior to the stress fracture in her lumbar spine. Per patient recent x-rays show improvement of fracture. She had therayp for her lower back several months ago. She had significant benefit. She has mild nmovement restrictions of her lumbar spine and strength defcits in herhips and core. She owuld benefit from an exercise program that epmhazizes neutral spine activity, as well as low intensity high repetion exercises for selctive stress to improve bone density.     History and Personal Factors relevant to plan of care:  stress fracture of the lumbar spine; SI deficit    Clinical Presentation  Stable    Clinical Decision Making  Low    Rehab Potential  Good    PT Frequency  2x / week    PT Duration  8 weeks    PT Treatment/Interventions  Passive range of motion;Therapeutic activities;Therapeutic exercise;Manual techniques;Dry needling;ADLs/Self Care Home Management;Cryotherapy;Electrical Stimulation;Ultrasound;Iontophoresis 4mg /ml Dexamethasone;Moist Heat;Traction;Gait training;Stair training;Taping    PT Next Visit Plan  Patient is here for a general program for long term ostepperosis prevention. She would benefit from isometric core strengthening next visit: consdier qudruped series; standing shoulder flexion with abdominal branbce and cuing for posture; bridging to neutral. Any type of other core strengthening exercises. Review gym exercises ayt some point; Patient is still very hesitant to lift objects. Add progressive lifting.  Consdier manual therapy and TPDN if needed.     PT Home Exercise Plan  supine scap stab series     Consulted and Agree with Plan of Care  Patient       Patient will benefit from skilled therapeutic intervention in order to improve the following deficits and impairments:  Pain, Postural dysfunction, Decreased activity tolerance, Impaired perceived functional ability  Visit  Diagnosis: Chronic bilateral low back pain without sciatica - Plan: PT plan of care cert/re-cert  Muscle weakness (generalized) - Plan: PT plan of care cert/re-cert     Problem List Patient Active Problem List   Diagnosis Date Noted  . Osteoporosis 07/27/2017  . 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 Nonallopathic lesion of lumbosacral region 07/12/2017  . Nonallopathic lesion of sacral region 07/12/2017  . Nonallopathic lesion of pelvic region 07/12/2017    Carney Living PT DPT  10/05/2017, 9:13 AM  [place], [place], [address] Phone: [phone]   Fax:  [phone]  Name: Ayris Carano MRN: [id number] Date of Birth: August 27, 1986

## 2017-10-13 ENCOUNTER — Encounter: Payer: Self-pay | Admitting: Family Medicine

## 2017-10-15 ENCOUNTER — Telehealth: Payer: Self-pay | Admitting: Family Medicine

## 2017-10-15 NOTE — Telephone Encounter (Signed)
Spouse dropped off [employer] for Medical Documentation form to be completed.  States Dr. Tamala Julian knows this form was to be dropped off.  Placing in Dr. Thompson Caul box.

## 2017-10-18 ENCOUNTER — Ambulatory Visit: Payer: Managed Care, Other (non HMO) | Attending: Internal Medicine | Admitting: Physical Therapy

## 2017-10-18 DIAGNOSIS — M6281 Muscle weakness (generalized): Secondary | ICD-10-CM

## 2017-10-18 DIAGNOSIS — G8929 Other chronic pain: Secondary | ICD-10-CM | POA: Diagnosis present

## 2017-10-18 DIAGNOSIS — M545 Low back pain, unspecified: Secondary | ICD-10-CM

## 2017-10-19 NOTE — Therapy (Signed)
[place], [place], [address] Phone: [phone]   Fax:  [phone]  Physical Therapy Treatment  Patient Details  Name: Allison [name] MRN: [id number] Date of Birth: November 15, 1986 Referring Provider: Dr Berkley Harvey    Encounter Date: 10/18/2017  PT End of Session - 10/19/17 1635    Visit Number  2    Number of Visits  16    Authorization Type  Aetna     PT Start Time  0430    PT Stop Time  0510    PT Time Calculation (min)  40 min    Activity Tolerance  Patient tolerated treatment well    Behavior During Therapy  [facility] for tasks assessed/performed       No past medical history on file.  No past surgical history on file.  There were no vitals filed for this visit.  Subjective Assessment - 10/19/17 1626    Subjective  No pain after inital evaluation. Has had some SI pain but has been to massage therapy today as well as a chiropracter.     Limitations  Standing;Walking;Lifting    Currently in Pain?  No/denies                      [facility] Adult PT Treatment/Exercise - 10/19/17 0001      Self-Care   Self-Care  Lifting    Lifting  box lift 2x10 10 and 20 lbs no pain in the lumbar spine      Lumbar Exercises: Stretches   Active Hamstring Stretch Limitations  2x20sec hold     Piriformis Stretch Limitations  2x20sec hold       Lumbar Exercises: Supine   Clam  20 reps    Bridge Limitations  2x10     Straight Leg Raises Limitations  2x10      Shoulder Exercises: Power Warden/ranger Exercises  shoulder row x20 both handles 20 lb; lat pull down 2x10 20 lbs              PT Education - 10/19/17 1627    Education provided  Yes    Education Details  Reviewed HEP; symptom mangement     Person(s) Educated  Patient    Methods  Explanation;Demonstration;Tactile cues;Verbal cues    Comprehension  Verbalized understanding;Returned demonstration       PT Short Term Goals  - 10/19/17 1638      PT SHORT TERM GOAL #1   Title  Patient will be indepdent with inital HEP for osteoperosis program     Time  4    Period  Weeks    Status  On-going      PT SHORT TERM GOAL #2   Title  Patient will increase gross bilateral lower extremity strength to 4+/5    Time  4    Period  Weeks    Status  On-going        PT Long Term Goals - 10/04/17 1629      PT LONG TERM GOAL #1   Title  Patient will be independent with complete program for osteoperosis prevention and maitenence    Time  8    Period  Weeks    Status  New    Target Date  11/29/17      PT LONG TERM GOAL #2   Title  Patient will return to light jogging if cleared by MD to do so  Time  8    Period  Weeks    Status  New    Target Date  11/29/17      PT LONG TERM GOAL #3   Title  Patient will demsotrate  a 24% limitation on FOTO     Time  8    Status  New    Target Date  11/29/17            Plan - 10/19/17 1637    Clinical Impression Statement  Therapy reviewed proper lifting technique and also reviewed gym exercises with the patient. She had no increase in pain. Therapy will add in weight bearing exercises next visit. Assess low back tolerance to exercises.     Clinical Presentation  Stable    Clinical Decision Making  Low    Rehab Potential  Good    PT Frequency  2x / week    PT Duration  8 weeks    PT Treatment/Interventions  Passive range of motion;Therapeutic activities;Therapeutic exercise;Manual techniques;Dry needling;ADLs/Self Care Home Management;Cryotherapy;Electrical Stimulation;Ultrasound;Iontophoresis 4mg /ml Dexamethasone;Moist Heat;Traction;Gait training;Stair training;Taping    PT Next Visit Plan  Patient is here for a general program for long term ostepperosis prevention. She would benefit from isometric core strengthening next visit: consdier qudruped series; standing shoulder flexion with abdominal branbce and cuing for posture; bridging to neutral. Any type of other core  strengthening exercises. Review gym exercises ayt some point; Patient is still very hesitant to lift objects. Add progressive lifting.  Consdier manual therapy and TPDN if needed.     PT Home Exercise Plan  supine scap stab series     Consulted and Agree with Plan of Care  Patient       Patient will benefit from skilled therapeutic intervention in order to improve the following deficits and impairments:  Pain, Postural dysfunction, Decreased activity tolerance, Impaired perceived functional ability  Visit Diagnosis: Chronic bilateral low back pain without sciatica  Muscle weakness (generalized)     Problem List Patient Active Problem List   Diagnosis Date Noted  . Osteoporosis 07/27/2017  . 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 Nonallopathic lesion of lumbosacral region 07/12/2017  . Nonallopathic lesion of sacral region 07/12/2017  . Nonallopathic lesion of pelvic region 07/12/2017    Carney Living PT DPT  10/19/2017, 4:39 PM  [place], [place], [address] Phone: [phone]   Fax:  [phone]  Name: Allison [name] MRN: [id number] Date of Birth: 18-Apr-1986

## 2017-10-20 NOTE — Telephone Encounter (Signed)
Pt made aware form was faxed.

## 2017-10-25 ENCOUNTER — Ambulatory Visit: Payer: Managed Care, Other (non HMO) | Admitting: Physical Therapy

## 2017-10-30 NOTE — Progress Notes (Signed)
[name] [place], [place] [address] Phone: [phone] Subjective:    I'm seeing this patient by the request  of:    CC: Back pain follow-up  [id number]  Allison [name] is a 31 y.o. female coming in with complaint of back pain follow-up.  Patient was found to have a positive ANA as well as.  And more of the sacroiliac arthritis.  Patient also had appeared to be more osteoporosis.  Patient has been on once weekly vitamin D as well as bisphosphonates.  Still seeing rheumatology.  We have attempted osteopathic manipulation as well as sacroiliac joint injections.  Patient states she has been going to formal physical therapy.  Patient states making improvement but slow.  Still tightness, no radiation  Worse at the end of the day      No past medical history on file. No past surgical history on file. Social History   Socioeconomic History  . Marital status: Married    Spouse name: None  . Number of children: None  . Years of education: None  . Highest education level: None  Social Needs  . Financial resource strain: None  . Food insecurity - worry: None  . Food insecurity - inability: None  . Transportation needs - medical: None  . Transportation needs - non-medical: None  Occupational History  . None  Tobacco Use  . Smoking status: Never Smoker  . Smokeless tobacco: Never Used  Substance and Sexual Activity  . Alcohol use: None  . Drug use: None  . Sexual activity: None  Other Topics Concern  . None  Social History Narrative  . None   Not on File No family history on file. no autoimmune disease.    Past medical history, social, surgical and family history all reviewed in electronic medical record.  No pertanent information unless stated regarding to the chief complaint.   Review of Systems:Review of systems updated and as accurate as of 11/01/17  No headache, visual changes, nausea, vomiting, diarrhea, constipation,  dizziness, abdominal pain, skin rash, fevers, chills, night sweats, weight loss, swollen lymph nodes, chest pain, shortness of breath, mood changes. + muscle aches, body aches.   Objective  Blood pressure 104/80, pulse 93, height 5\' 5"  (1.651 m), weight 125 lb (56.7 kg), SpO2 99 %. Systems examined below as of 11/01/17   General: No apparent distress alert and oriented x3 mood and affect normal, dressed appropriately.  HEENT: Pupils equal, extraocular movements intact  Respiratory: Patient's speak in full sentences and does not appear short of breath  Cardiovascular: No lower extremity edema, non tender, no erythema  Skin: Warm dry intact with no signs of infection or rash on extremities or on axial skeleton.  Abdomen: Soft nontender  Neuro: Cranial nerves II through XII are intact, neurovascularly intact in all extremities with 2+ DTRs and 2+ pulses.  Lymph: No lymphadenopathy of posterior or anterior cervical chain or axillae bilaterally.  Gait normal with good balance and coordination.  MSK:  Non tender with full range of motion and good stability and symmetric strength and tone of shoulders, elbows, wrist, hip, knee and ankles bilaterally.  Hypermobility of multiple joints Neck: Inspection unremarkable. No palpable stepoffs. Negative Spurling's maneuver. Full neck range of motion Grip strength and sensation normal in bilateral hands Strength good C4 to T1 distribution No sensory change to C4 to T1 Negative Hoffman sign bilaterally Reflexes normal neck:  Still shows a mild positive Faber bilaterally.  Negative straight  leg test.  Mild pain over the iliolumbar ligament bilaterally full range of motion with mild hyper extension  Osteopathic findings C2 flexed rotated and side bent right C6 flexed rotated and side bent left T3 extended rotated and side bent right inhaled third rib L2 flexed rotated and side bent right Sacrum right on right    Impression and Recommendations:      This case required medical decision making of moderate complexity.      Note: This dictation was prepared with Dragon dictation along with smaller phrase technology. Any transcriptional errors that result from this process are unintentional.

## 2017-11-01 ENCOUNTER — Encounter: Payer: Self-pay | Admitting: Physical Therapy

## 2017-11-01 ENCOUNTER — Ambulatory Visit: Payer: Managed Care, Other (non HMO) | Admitting: Physical Therapy

## 2017-11-01 ENCOUNTER — Ambulatory Visit: Payer: Managed Care, Other (non HMO) | Admitting: Family Medicine

## 2017-11-01 ENCOUNTER — Encounter: Payer: Self-pay | Admitting: Family Medicine

## 2017-11-01 VITALS — BP 104/80 | HR 93 | Ht 65.0 in | Wt 125.0 lb

## 2017-11-01 DIAGNOSIS — M6281 Muscle weakness (generalized): Secondary | ICD-10-CM

## 2017-11-01 DIAGNOSIS — M999 Biomechanical lesion, unspecified: Secondary | ICD-10-CM | POA: Diagnosis not present

## 2017-11-01 DIAGNOSIS — G8929 Other chronic pain: Secondary | ICD-10-CM

## 2017-11-01 DIAGNOSIS — M545 Low back pain, unspecified: Secondary | ICD-10-CM

## 2017-11-01 DIAGNOSIS — M533 Sacrococcygeal disorders, not elsewhere classified: Secondary | ICD-10-CM

## 2017-11-01 NOTE — Patient Instructions (Signed)
Good to see you  Ice 20 minutes 2 times daily. Usually after activity and before bed. Keep working on [employer] and the posture Hope they help the work environment  See me again in 4 weeks

## 2017-11-01 NOTE — Assessment & Plan Note (Signed)
Improving slowly over the course of time.  Encourage patient continue to work on diet and exercises.  We discussed home exercises, icing regimen, which activities to do which wants to avoid.  Patient is going to slowly increase activity.  We discussed we could start a running progression if necessary.  Patient wants to hold on that until next year.  Follow-up again in 4 weeks

## 2017-11-01 NOTE — Assessment & Plan Note (Signed)
Decision today to treat with OMT was based on Physical Exam  After verbal consent patient was treated with HVLA, ME, FPR techniques in cervical, thoracic, rib, lumbar and sacral areas  Patient tolerated the procedure well with improvement in symptoms  Patient given exercises, stretches and lifestyle modifications  See medications in patient instructions if given  Patient will follow up in 4 weeks 

## 2017-11-02 ENCOUNTER — Encounter: Payer: Self-pay | Admitting: Physical Therapy

## 2017-11-02 NOTE — Therapy (Signed)
[place], [place], [address] Phone: [phone]   Fax:  [phone]  Physical Therapy Treatment/ Discharge   Patient Details  Name: Allison [name] MRN: [id number] Date of Birth: October 21, 1986 Referring Provider: Dr Berkley Harvey    Encounter Date: 11/01/2017  PT End of Session - 11/02/17 1009    Visit Number  3    Number of Visits  16    Authorization Type  [employer]     PT Start Time  4128    PT Stop Time  1716    PT Time Calculation (min)  41 min    Activity Tolerance  Patient tolerated treatment well    Behavior During Therapy  [facility] for tasks assessed/performed       History reviewed. No pertinent past medical history.  History reviewed. No pertinent surgical history.  There were no vitals filed for this visit.  Subjective Assessment - 11/01/17 1639    Subjective  Patient had no pain after the last visit. She has been to the gym and working on her exercises. She continues to have some SI pain.     Limitations  Standing;Lifting    Currently in Pain?  No/denies                      [facility] Adult PT Treatment/Exercise - 11/02/17 0001      Shoulder Exercises: Seated   Other Seated Exercises  shoulder press x10 8lb;        Shoulder Exercises: Prone   Other Prone Exercises  quadruped row 5 lb; quadruped extension 2x10; bird dog 2x10;       Shoulder Exercises: Standing   Other Standing Exercises  standing shoulder flexion 2x10 5 lb; 5 lb Y 2x10;       Shoulder Exercises: Power Warden/ranger Exercises  shoulder row x20 both handles 20 lb; lat pull down 2x10 20 lbs              PT Education - 11/02/17 1509    Education provided  Yes    Education Details  updated HEP     Person(s) Educated  Patient    Methods  Explanation;Demonstration;Tactile cues;Verbal cues    Comprehension  Verbalized understanding;Returned demonstration;Verbal cues required;Tactile cues required        PT Short Term Goals - 10/19/17 1638      PT SHORT TERM GOAL #1   Title  Patient will be indepdent with inital HEP for osteoperosis program     Time  4    Period  Weeks    Status  On-going      PT SHORT TERM GOAL #2   Title  Patient will increase gross bilateral lower extremity strength to 4+/5    Time  4    Period  Weeks    Status  On-going        PT Long Term Goals - 10/04/17 1629      PT LONG TERM GOAL #1   Title  Patient will be independent with complete program for osteoperosis prevention and maitenence    Time  8    Period  Weeks    Status  New    Target Date  11/29/17      PT LONG TERM GOAL #2   Title  Patient will return to light jogging if cleared by MD to do so    Time  8    Period  Weeks  Status  New    Target Date  11/29/17      PT LONG TERM GOAL #3   Title  Patient will demsotrate  a 24% limitation on FOTO     Time  8    Status  New    Target Date  11/29/17            Plan - 11/02/17 1511    Clinical Impression Statement  Patient has reached max benefit of thereapy. She has a complete workout program. She is already working on high level core exercises. Therapy reviewed free weight exercises. She was advised to work on low weight high rep exercises and to be aware of her posture. D/C to HEP.     Clinical Presentation  Stable    Clinical Decision Making  Low    Rehab Potential  Good    PT Frequency  2x / week    PT Duration  8 weeks    PT Treatment/Interventions  Passive range of motion;Therapeutic activities;Therapeutic exercise;Manual techniques;Dry needling;ADLs/Self Care Home Management;Cryotherapy;Electrical Stimulation;Ultrasound;Iontophoresis 17m/ml Dexamethasone;Moist Heat;Traction;Gait training;Stair training;Taping    PT Next Visit Plan  D/C to HEP     PT Home Exercise Plan  supine scap stab series        Patient will benefit from skilled therapeutic intervention in order to improve the following deficits and impairments:   Pain, Postural dysfunction, Decreased activity tolerance, Impaired perceived functional ability  Visit Diagnosis: Chronic bilateral low back pain without sciatica  Muscle weakness (generalized)  PHYSICAL THERAPY DISCHARGE SUMMARY  Visits from Start of Care: 3  Current functional level related to goals / functional outcomes: Has a full HEP    Remaining deficits: Continues to have minor back pain at times    Education / Equipment: HEP  Plan: Patient agrees to discharge.  Patient goals were met. Patient is being discharged due to meeting the stated rehab goals.  ?????       Problem List Patient Active Problem List   Diagnosis Date Noted  . Nonallopathic lesion of thoracic region 11/01/2017  . Osteoporosis 07/27/2017  . 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 Nonallopathic lesion of lumbosacral region 07/12/2017  . Nonallopathic lesion of sacral region 07/12/2017  . Nonallopathic lesion of pelvic region 07/12/2017    DCarney LivingPT DPT  11/02/2017, 3:18 PM  [place] [id number]: [phone]  Fax:  [phone] Name: Allison DebrulerMRN: [id number] of Birth: 710-Aug-1987

## 2017-11-28 NOTE — Progress Notes (Signed)
[name] [place] [place], [place] [address] Phone: [phone] Subjective:    I'm seeing this patient by the request  of:    CC: Back pain follow-up  [id number]  Allison [name] is a 32 y.o. female coming in with complaint of back pain.  Found to have more of a sacroiliitis.  Patient was also found to have true osteoporosis.  Formal physical therapy.  Has responded fairly well to osteopathic manipulation as well as sacroiliac injections.  ANA positive and being followed by rheumatology.  Patient does have a history of psoriasis.  Patient states overall is doing relatively well.  Patient is scheduled for another bone density in the near future.  Patient feels that the medication seems to be beneficial.  Start increasing the exercises on a more regular basis.  Seems to be feeling better overall.     No past medical history on file. No past surgical history on file. Social History   Socioeconomic History  . Marital status: Married    Spouse name: None  . Number of children: None  . Years of education: None  . Highest education level: None  Social Needs  . Financial resource strain: None  . Food insecurity - worry: None  . Food insecurity - inability: None  . Transportation needs - medical: None  . Transportation needs - non-medical: None  Occupational History  . None  Tobacco Use  . Smoking status: Never Smoker  . Smokeless tobacco: Never Used  Substance and Sexual Activity  . Alcohol use: None  . Drug use: None  . Sexual activity: None  Other Topics Concern  . None  Social History Narrative  . None   Not on File No family history on file.   Past medical history, social, surgical and family history all reviewed in electronic medical record.  No pertanent information unless stated regarding to the chief complaint.   Review of Systems:Review of systems updated and as accurate as of 11/29/17  No headache, visual changes, nausea,  vomiting, diarrhea, constipation, dizziness, abdominal pain, skin rash, fevers, chills, night sweats, weight loss, swollen lymph nodes, body aches, joint swelling, muscle aches, chest pain, shortness of breath, mood changes.   Objective  Blood pressure 110/80, pulse 94, height 5\' 5"  (1.651 m), weight 125 lb (56.7 kg), SpO2 99 %. Systems examined below as of 11/29/17   General: No apparent distress alert and oriented x3 mood and affect normal, dressed appropriately.  HEENT: Pupils equal, extraocular movements intact  Respiratory: Patient's speak in full sentences and does not appear short of breath  Cardiovascular: No lower extremity edema, non tender, no erythema  Skin: Warm dry intact with no signs of infection or rash on extremities or on axial skeleton.  Abdomen: Soft nontender  Neuro: Cranial nerves II through XII are intact, neurovascularly intact in all extremities with 2+ DTRs and 2+ pulses.  Lymph: No lymphadenopathy of posterior or anterior cervical chain or axillae bilaterally.  Gait normal with good balance and coordination.  MSK:  Non tender with full range of motion and good stability and symmetric strength and tone of shoulders, elbows, wrist, hip, knee and ankles bilaterally.  Back Exam:  Inspection: The doses Motion: Flexion 45 deg, Extension 35 deg, Side Bending to 25 deg bilaterally,  Rotation to 25 deg bilaterally  SLR laying: Negative  XSLR laying: Negative  Palpable tenderness: Tender to palpation in the paraspinal musculature lumbar spine right greater than left. FABER: negative. Sensory change:  Gross sensation intact to all lumbar and sacral dermatomes.  Reflexes: 2+ at both patellar tendons, 2+ at achilles tendons, Babinski's downgoing.  Strength at foot  Plantar-flexion: 5/5 Dorsi-flexion: 5/5 Eversion: 5/5 Inversion: 5/5  Leg strength  Quad: 5/5 Hamstring: 5/5 Hip flexor: 5/5 Hip abductors: 5/5  Gait unremarkable.  Osteopathic findings T3 extended rotated  and side bent right inhaled third rib T9 extended rotated and side bent left L2 flexed rotated and side bent right Sacrum right on right    Impression and Recommendations:     This case required medical decision making of moderate complexity.      Note: This dictation was prepared with Dragon dictation along with smaller phrase technology. Any transcriptional errors that result from this process are unintentional.

## 2017-11-29 ENCOUNTER — Encounter: Payer: Self-pay | Admitting: Family Medicine

## 2017-11-29 ENCOUNTER — Ambulatory Visit: Payer: Managed Care, Other (non HMO) | Admitting: Family Medicine

## 2017-11-29 VITALS — BP 110/80 | HR 94 | Ht 65.0 in | Wt 125.0 lb

## 2017-11-29 DIAGNOSIS — M533 Sacrococcygeal disorders, not elsewhere classified: Secondary | ICD-10-CM | POA: Diagnosis not present

## 2017-11-29 DIAGNOSIS — M999 Biomechanical lesion, unspecified: Secondary | ICD-10-CM | POA: Diagnosis not present

## 2017-11-29 NOTE — Assessment & Plan Note (Signed)
Started osteopathic manipulation again.  Patient is not taking the once weekly because she does feel that the laboratory results were normal.  Patient is doing relatively well.  Discussed icing regimen and home exercises.  Continue to work on hip abductor strengthening.  Discussed importance of weight training.  Follow-up again in 2-3 months

## 2017-11-29 NOTE — Assessment & Plan Note (Signed)
Decision today to treat with OMT was based on Physical Exam  After verbal consent patient was treated with HVLA, ME, FPR techniques in  thoracic, lumbar and sacral areas  Patient tolerated the procedure well with improvement in symptoms  Patient given exercises, stretches and lifestyle modifications  See medications in patient instructions if given  Patient will follow up in 4-8 weeks 

## 2017-11-29 NOTE — Patient Instructions (Signed)
Good to see you  Alvera Singh is your friend.  Keep ding what you are doing.  Briscoe Deutscher D.O at [employer] [place] if looking for a house.  See me again in 2-3 months!

## 2018-01-24 ENCOUNTER — Ambulatory Visit: Payer: Managed Care, Other (non HMO) | Admitting: Family Medicine

## 2018-01-26 ENCOUNTER — Encounter: Payer: Self-pay | Admitting: Family Medicine

## 2018-02-05 NOTE — Progress Notes (Signed)
[name] [place], [place] [address] Phone: [phone] Subjective:       CC: Back pain follow-up  [id number]  Allison [name] is a 32 y.o. female coming in with complaint of back pain.  Patient was found initially to have a compression fracture as well as some osteoporosis.  Significant sacroiliac swelling noted.  Found to be positive for autoimmune disease.  Seen rheumatology. Responded well to an injection in the sacroiliac joint.  Patient does not have any pain at this time.  Feels like she has 100% better at the moment.  Has not felt this good in quite some time.  Was going to be considering PRP injection previously.     No past medical history on file. No past surgical history on file. Social History   Socioeconomic History  . Marital status: Married    Spouse name: Not on file  . Number of children: Not on file  . Years of education: Not on file  . Highest education level: Not on file  Occupational History  . Not on file  Social Needs  . Financial resource strain: Not on file  . Food insecurity:    Worry: Not on file    Inability: Not on file  . Transportation needs:    Medical: Not on file    Non-medical: Not on file  Tobacco Use  . Smoking status: Never Smoker  . Smokeless tobacco: Never Used  Substance and Sexual Activity  . Alcohol use: Not on file  . Drug use: Not on file  . Sexual activity: Not on file  Lifestyle  . Physical activity:    Days per week: Not on file    Minutes per session: Not on file  . Stress: Not on file  Relationships  . Social connections:    Talks on phone: Not on file    Gets together: Not on file    Attends religious service: Not on file    Active member of club or organization: Not on file    Attends meetings of clubs or organizations: Not on file    Relationship status: Not on file  Other Topics Concern  . Not on file  Social History Narrative  . Not on file   Not on  File No family history on file.   Past medical history, social, surgical and family history all reviewed in electronic medical record.  No pertanent information unless stated regarding to the chief complaint.   Review of Systems:Review of systems updated and as accurate as of 02/07/18  No headache, visual changes, nausea, vomiting, diarrhea, constipation, dizziness, abdominal pain, skin rash, fevers, chills, night sweats, weight loss, swollen lymph nodes, body aches, joint swelling, muscle aches, chest pain, shortness of breath, mood changes.   Objective  Blood pressure 118/72, pulse 81, height 5\' 5"  (1.651 m), weight 126 lb (57.2 kg), SpO2 98 %. Systems examined below as of 02/07/18   General: No apparent distress alert and oriented x3 mood and affect normal, dressed appropriately.  HEENT: Pupils equal, extraocular movements intact  Respiratory: Patient's speak in full sentences and does not appear short of breath  Cardiovascular: No lower extremity edema, non tender, no erythema  Skin: Warm dry intact with no signs of infection or rash on extremities or on axial skeleton.  Abdomen: Soft nontender  Neuro: Cranial nerves II through XII are intact, neurovascularly intact in all extremities with 2+ DTRs and 2+ pulses.  Lymph: No lymphadenopathy of  posterior or anterior cervical chain or axillae bilaterally.  Gait normal with good balance and coordination.  MSK:  Non tender with full range of motion and good stability and symmetric strength and tone of shoulders, elbows, wrist, hip, knee and ankles bilaterally.  Back Exam:  Inspection: Unremarkable  Motion: Flexion 45 deg, Extension 25 deg, Side Bending to 45 deg bilaterally,  Rotation to 45 deg bilaterally  SLR laying: Negative  XSLR laying: Negative  Palpable tenderness: Tender to palpation in the left sacroiliac joint bilaterally. FABER: negative. Sensory change: Gross sensation intact to all lumbar and sacral dermatomes.  Reflexes:  2+ at both patellar tendons, 2+ at achilles tendons, Babinski's downgoing.  Strength at foot  Plantar-flexion: 5/5 Dorsi-flexion: 5/5 Eversion: 5/5 Inversion: 5/5  Leg strength  Quad: 5/5 Hamstring: 5/5 Hip flexor: 5/5 Hip abductors: 5/5  Gait unremarkable.     Impression and Recommendations:     This case required medical decision making of moderate complexity.      Note: This dictation was prepared with Dragon dictation along with smaller phrase technology. Any transcriptional errors that result from this process are unintentional.        rasound guided injection.

## 2018-02-07 ENCOUNTER — Encounter: Payer: Self-pay | Admitting: Family Medicine

## 2018-02-07 ENCOUNTER — Ambulatory Visit: Payer: Managed Care, Other (non HMO) | Admitting: Family Medicine

## 2018-02-07 DIAGNOSIS — M533 Sacrococcygeal disorders, not elsewhere classified: Secondary | ICD-10-CM

## 2018-02-07 NOTE — Patient Instructions (Signed)
Good to see you  Ice is your friend when needed Lets not change anything right now.  Send a message in 3 weeks  Call [phone] when you need [place]  Otherwise I hope I see you out and about.

## 2018-02-07 NOTE — Assessment & Plan Note (Signed)
Doing better at this time.  Not having any significant pain.  We discussed icing regimen and home exercise.  Worsening symptoms we will consider the other injections we were already considering.  At this point all patient will follow-up as needed.

## 2018-03-02 ENCOUNTER — Encounter: Payer: Self-pay | Admitting: Family Medicine

## 2018-05-29 NOTE — Progress Notes (Signed)
[name] [address] [place], [place] [address] Phone: [phone] Subjective:      CC: Back pain follow-up  [id number]  Jimi Giza is a 32 y.o. female coming in with complaint of back pain. She began working out 3 weeks ago. Performed high knees and had pain in her upper back. Pain is on right rhomboid and left scapula. Patient did go to Urgent Care for an xray due to the pain and history of osteoporosis.      No past medical history on file. No past surgical history on file. Social History   Socioeconomic History  . Marital status: Married    Spouse name: Not on file  . Number of children: Not on file  . Years of education: Not on file  . Highest education level: Not on file  Occupational History  . Not on file  Social Needs  . Financial resource strain: Not on file  . Food insecurity:    Worry: Not on file    Inability: Not on file  . Transportation needs:    Medical: Not on file    Non-medical: Not on file  Tobacco Use  . Smoking status: Never Smoker  . Smokeless tobacco: Never Used  Substance and Sexual Activity  . Alcohol use: Not on file  . Drug use: Not on file  . Sexual activity: Not on file  Lifestyle  . Physical activity:    Days per week: Not on file    Minutes per session: Not on file  . Stress: Not on file  Relationships  . Social connections:    Talks on phone: Not on file    Gets together: Not on file    Attends religious service: Not on file    Active member of club or organization: Not on file    Attends meetings of clubs or organizations: Not on file    Relationship status: Not on file  Other Topics Concern  . Not on file  Social History Narrative  . Not on file   Not on File No family history on file.  No family history of autoimmune   Past medical history, social, surgical and family history all reviewed in electronic medical record.  No pertanent information unless stated regarding to the  chief complaint.   Review of Systems:Review of systems updated and as accurate as of 05/30/18  No headache, visual changes, nausea, vomiting, diarrhea, constipation, dizziness, abdominal pain, skin rash, fevers, chills, night sweats, weight loss, swollen lymph nodes, body aches, joint swelling,  chest pain, shortness of breath, mood changes.  Positive muscle aches  Objective  Blood pressure 118/86, pulse 86, height 5\' 5"  (1.651 m), weight 128 lb (58.1 kg), SpO2 98 %. Systems examined below as of 05/30/18   General: No apparent distress alert and oriented x3 mood and affect normal, dressed appropriately.  HEENT: Pupils equal, extraocular movements intact  Respiratory: Patient's speak in full sentences and does not appear short of breath  Cardiovascular: No lower extremity edema, non tender, no erythema  Skin: Warm dry intact with no signs of infection or rash on extremities or on axial skeleton.  Abdomen: Soft nontender  Neuro: Cranial nerves II through XII are intact, neurovascularly intact in all extremities with 2+ DTRs and 2+ pulses.  Lymph: No lymphadenopathy of posterior or anterior cervical chain or axillae bilaterally.  Gait normal with good balance and coordination.  MSK:  Non tender with full range of motion and good stability  and symmetric strength and tone of shoulders, elbows, wrist, hip, knee and ankles bilaterally.  Back Exam:  Inspection: Loss of lordosis Motion: Flexion 45 deg, Extension 25 deg, Side Bending to 35 deg bilaterally,  Rotation to 35 deg bilaterally  SLR laying: Negative  XSLR laying: Negative  Palpable tenderness: Tightness in the parascapular region on the right side.  Likely slipper. FABER: Tightness bilaterally. Sensory change: Gross sensation intact to all lumbar and sacral dermatomes.  Reflexes: 2+ at both patellar tendons, 2+ at achilles tendons, Babinski's downgoing.  Strength at foot  Plantar-flexion: 5/5 Dorsi-flexion: 5/5 Eversion: 5/5 Inversion:  5/5  Leg strength  Quad: 5/5 Hamstring: 5/5 Hip flexor: 5/5 Hip abductors: 4/5 but symmetric   Osteopathic findings C2 flexed rotated and side bent right C4 flexed rotated and side bent left T3 extended rotated and side bent right inhaled third rib T9 extended rotated and side bent left L2 flexed rotated and side bent right Sacrum right on right    Impression and Recommendations:     This case required medical decision making of moderate complexity.      Note: This dictation was prepared with Dragon dictation along with smaller phrase technology. Any transcriptional errors that result from this process are unintentional.

## 2018-05-30 ENCOUNTER — Ambulatory Visit: Payer: Managed Care, Other (non HMO) | Admitting: Family Medicine

## 2018-05-30 ENCOUNTER — Encounter: Payer: Self-pay | Admitting: Family Medicine

## 2018-05-30 VITALS — BP 118/86 | HR 86 | Ht 65.0 in | Wt 128.0 lb

## 2018-05-30 DIAGNOSIS — M999 Biomechanical lesion, unspecified: Secondary | ICD-10-CM | POA: Diagnosis not present

## 2018-05-30 DIAGNOSIS — M940 Chondrocostal junction syndrome [Tietze]: Secondary | ICD-10-CM

## 2018-05-30 HISTORY — DX: Chondrocostal junction syndrome (tietze): M94.0

## 2018-05-30 NOTE — Patient Instructions (Signed)
You are awesome Keep it up  Likely just a rib  You should do well  You know where I am if you need me

## 2018-05-30 NOTE — Assessment & Plan Note (Signed)
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  Patient responded well to manipulation.  We discussed icing regimen and home exercise.  Discussed which activities to do which wants to avoid.  We discussed continuing the vitamin D supplementation.  Did review patient's outside images.  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

## 2018-05-30 NOTE — Assessment & Plan Note (Signed)
Decision today to treat with OMT was based on Physical Exam  After verbal consent patient was treated with HVLA, ME, FPR techniques in cervical, thoracic, rib lumbar and sacral areas  Patient tolerated the procedure well with improvement in symptoms  Patient given exercises, stretches and lifestyle modifications  See medications in patient instructions if given  Patient will follow up in 4-8 weeks 

## 2018-06-26 NOTE — Progress Notes (Signed)
Allison [name] is a 32 y.o. female is here to [employer].   Patient Care Team: Briscoe Deutscher, DO as PCP - General (Family Medicine) Delrae Rend, MD as Consulting Physician (Endocrinology) Lesle Reek, NP as Nurse Practitioner (Neurosurgery) Lyndal Pulley, DO as Consulting Physician (Family Medicine)   History of Present Illness:   Lonell Grandchild, CMA acting as scribe for Dr. Briscoe Deutscher.   [id number] in office to establish care. She was referred by Dr. Tamala Julian. She would like to talk about birth control options.   Health Maintenance Due  Topic Date Due  . HIV Screening  06/03/2002  . PAP SMEAR  06/03/2008  . INFLUENZA VACCINE  06/16/2018   Depression screen PHQ 2/9 06/27/2018  Decreased Interest 0  Down, Depressed, Hopeless 0  PHQ - 2 Score 0  Altered sleeping 0  Tired, decreased energy 0  Change in appetite 0  Feeling bad or failure about yourself  0  Trouble concentrating 0  Moving slowly or fidgety/restless 0  Suicidal thoughts 0  PHQ-9 Score 0    PMHx, SurgHx, SocialHx, Medications, and Allergies were reviewed in the Visit Navigator and updated as appropriate.   Past Medical History:  Diagnosis Date  . Chicken pox   . Heart murmur   . UTI (urinary tract infection)    Past Surgical History:  Procedure Laterality Date  . arthroscopic knee Right 2002  . WISDOM TOOTH EXTRACTION  2005   Family History  Problem Relation Age of Onset  . Depression Mother   . Miscarriages / [place] Mother   . High Cholesterol Father   . Early death Maternal Grandmother        Brain aneurysm   . Cancer Maternal Grandfather   . COPD Paternal Grandmother   . Heart attack Paternal Grandmother    Social History   Tobacco Use  . Smoking status: Never Smoker  . Smokeless tobacco: Never Used  Substance Use Topics  . Alcohol use: Yes  . Drug use: Never    Current Medications and Allergies:   Current Outpatient Medications:  .  Calcium  Carb-Cholecalciferol (CALCIUM-VITAMIN D) 500-200 MG-UNIT tablet, Take by mouth., Disp: , Rfl:  .  cholecalciferol (VITAMIN D) 1000 units tablet, Take 2,000 Units by mouth daily., Disp: , Rfl:    Allergies  Allergen Reactions  . Morphine Rash   Review of Systems:   Pertinent items are noted in the HPI. Otherwise, ROS is negative.  Vitals:   Vitals:   06/27/18 1026  BP: 110/68  Pulse: 80  Temp: 98 F (36.7 C)  TempSrc: Oral  SpO2: 99%  Weight: 126 lb 3.2 oz (57.2 kg)  Height: 5\' 5"  (1.651 m)     Body mass index is 21 kg/m.  Physical Exam:   Physical Exam  Constitutional: She appears well-nourished.  HENT:  Head: Normocephalic and atraumatic.  Eyes: Pupils are equal, round, and reactive to light. EOM are normal.  Neck: Normal range of motion. Neck supple.  Cardiovascular: Normal rate, regular rhythm, normal heart sounds and intact distal pulses.  Pulmonary/Chest: Effort normal.  Abdominal: Soft.  Skin: Skin is warm.  Psychiatric: She has a normal mood and affect. Her behavior is normal.  Nursing note and vitals reviewed.  Assessment and Plan:   Chihiro was seen today for establish care.  Diagnoses and all orders for this visit:  Birth control counseling Comments: Interested in IUD. Will refer to Dr. Talbert Nan. Orders: -     Ambulatory referral to Obstetrics /  Gynecology  Osteoporosis, followed by Dr. Buddy Duty  History of lumbar compression fracture   . Reviewed expectations re: course of current medical issues. . Discussed self-management of symptoms. . Outlined signs and symptoms indicating need for more acute intervention. . Patient verbalized understanding and all questions were answered. Marland Kitchen Health Maintenance issues including appropriate healthy diet, exercise, and smoking avoidance were discussed with patient. . See orders for this visit as documented in the electronic medical record. . Patient received an After Visit Summary.  CMA served as Education administrator  during this visit. History, Physical, and Plan performed by medical provider. The above documentation has been reviewed and is accurate and complete. Briscoe Deutscher, D.O.  Briscoe Deutscher, DO [place], [facility] 06/27/2018

## 2018-06-27 ENCOUNTER — Ambulatory Visit: Payer: Managed Care, Other (non HMO) | Admitting: Family Medicine

## 2018-06-27 ENCOUNTER — Telehealth: Payer: Self-pay | Admitting: Obstetrics and Gynecology

## 2018-06-27 ENCOUNTER — Encounter: Payer: Self-pay | Admitting: Family Medicine

## 2018-06-27 VITALS — BP 110/68 | HR 80 | Temp 98.0°F | Ht 65.0 in | Wt 126.2 lb

## 2018-06-27 DIAGNOSIS — M810 Age-related osteoporosis without current pathological fracture: Secondary | ICD-10-CM

## 2018-06-27 DIAGNOSIS — S32000S Wedge compression fracture of unspecified lumbar vertebra, sequela: Secondary | ICD-10-CM | POA: Diagnosis not present

## 2018-06-27 DIAGNOSIS — Z3009 Encounter for other general counseling and advice on contraception: Secondary | ICD-10-CM | POA: Diagnosis not present

## 2018-06-27 HISTORY — DX: Wedge compression fracture of unspecified lumbar vertebra, sequela: S32.000S

## 2018-06-27 NOTE — Telephone Encounter (Signed)
Called and left a message for patient to call back to schedule a new patient doctor referral appointment with our office to see Dr. Talbert Nan for a consultation re: IUD placement.

## 2018-07-11 ENCOUNTER — Ambulatory Visit: Payer: Managed Care, Other (non HMO) | Admitting: Family Medicine

## 2018-07-11 DIAGNOSIS — L409 Psoriasis, unspecified: Secondary | ICD-10-CM

## 2018-07-11 DIAGNOSIS — E739 Lactose intolerance, unspecified: Secondary | ICD-10-CM

## 2018-07-11 DIAGNOSIS — M810 Age-related osteoporosis without current pathological fracture: Secondary | ICD-10-CM

## 2018-07-11 DIAGNOSIS — Z8349 Family history of other endocrine, nutritional and metabolic diseases: Secondary | ICD-10-CM | POA: Insufficient documentation

## 2018-07-11 DIAGNOSIS — R011 Cardiac murmur, unspecified: Secondary | ICD-10-CM | POA: Insufficient documentation

## 2018-07-11 DIAGNOSIS — S32000S Wedge compression fracture of unspecified lumbar vertebra, sequela: Secondary | ICD-10-CM

## 2018-07-11 HISTORY — DX: Lactose intolerance, unspecified: E73.9

## 2018-07-11 HISTORY — DX: Psoriasis, unspecified: L40.9

## 2018-07-13 NOTE — Progress Notes (Signed)
[name] [address] [place], [place] [address] Phone: [phone] Subjective:    Allison Allison [name], am serving as a scribe for Dr. Hulan Saas.  CC: Back pain follow-up  [id number]  Allison Allison [name] is a 32 y.o. female coming in with complaint of thoracic spine pain. She had some pain in the past week but improved right before her appointment today. She is moving tomorrow and has been packing. Notes that she has left shoulder popping with shoulder elevation and shoulder extension with the rowing motion. Allison [name] pain with popping sensation. She has not been working out since we last saw her. History of autoimmune disease as well as osteoporosis.     Past Medical History:  Diagnosis Date  . Chicken pox   . Heart murmur   . UTI (urinary tract infection)    Past Surgical History:  Procedure Laterality Date  . arthroscopic knee Right 2002  . WISDOM TOOTH EXTRACTION  2005   Social History   Socioeconomic History  . Marital status: Married    Spouse name: Not on file  . Number of children: Not on file  . Years of education: Not on file  . Highest education level: Not on file  Occupational History  . Occupation: optomerist    Comment: Works at [employer]  . Financial resource strain: Not on file  . Food insecurity:    Worry: Not on file    Inability: Not on file  . Transportation needs:    Medical: Not on file    Non-medical: Not on file  Tobacco Use  . Smoking status: Never Smoker  . Smokeless tobacco: Never Used  Substance and Sexual Activity  . Alcohol use: Yes    Alcohol/week: 2.0 standard drinks    Types: 2 Glasses of wine per week  . Drug use: Never  . Sexual activity: Yes    Partners: Male  Lifestyle  . Physical activity:    Days per week: Not on file    Minutes per session: Not on file  . Stress: Not on file  Relationships  . Social connections:    Talks on phone: Not on file    Gets together: Not on file   Attends religious service: Not on file    Active member of club or organization: Not on file    Attends meetings of clubs or organizations: Not on file    Relationship status: Not on file  Other Topics Concern  . Not on file  Social History Narrative  . Not on file   Allergies  Allergen Reactions  . Morphine Rash    Age 37 said felt like skin burning tolerated hydrocodone in past    Family History  Problem Relation Age of Onset  . Depression Mother   . Miscarriages / [place] Mother   . High Cholesterol Father   . Early death Maternal Grandmother        Brain aneurysm   . Cancer Maternal Grandfather   . COPD Paternal Grandmother   . Heart attack Paternal Grandmother          Current Outpatient Medications (Other):  Marland Kitchen  Calcium Carb-Cholecalciferol (CALCIUM-VITAMIN D) 500-200 MG-UNIT tablet, Take by mouth. .  cholecalciferol (VITAMIN D) 1000 units tablet, Take 2,000 Units by mouth daily.    Past medical history, social, surgical and family history all reviewed in electronic medical record.  Allison [name] unless stated regarding to the chief complaint.  Review of Systems:  Allison [name] headache, visual changes, nausea, vomiting, diarrhea, constipation, dizziness, abdominal pain, skin rash, fevers, chills, night sweats, weight loss, swollen lymph nodes, joint swelling,  chest pain, shortness of breath, mood changes.  Positive muscle aches and body aches  Objective  Blood pressure 110/78, pulse 92, height 5\' 5"  (1.651 m), weight 126 lb (57.2 kg), last menstrual period 06/26/2018, SpO2 98 %. Systems examined below as of    General: Allison [name] apparent distress alert and oriented x3 mood and affect normal, dressed appropriately.  HEENT: Pupils equal, extraocular movements intact  Respiratory: Patient's speak in full sentences and does not appear short of breath  Cardiovascular: Allison [name] lower extremity edema, non tender, Allison [name] erythema  Skin: Warm dry intact with Allison [name] signs of infection  or rash on extremities or on axial skeleton.  Abdomen: Soft nontender  Neuro: Cranial nerves II through XII are intact, neurovascularly intact in all extremities with 2+ DTRs and 2+ pulses.  Lymph: Allison [name] lymphadenopathy of posterior or anterior cervical chain or axillae bilaterally.  Gait normal with good balance and coordination.  MSK:  Non tender with full range of motion and good stability and symmetric strength and tone of shoulders, elbows, wrist, hip, knee and ankles bilaterally.  Mild hypermobility  Neck: Inspection unremarkable. Allison [name] palpable stepoffs. Negative Spurling's maneuver. Mild limitation in rotation and sidebending Grip strength and sensation normal in bilateral hands Strength good C4 to T1 distribution Allison [name] sensory change to C4 to T1 Negative Hoffman sign bilaterally Reflexes normal Tightness in the left trapezius   Osteopathic findings C2 flexed rotated and side bent right C6 flexed rotated and side bent left T3 extended rotated and side bent right inhaled third rib L2 flexed rotated and side bent right Sacrum right on right Anterior ilium left    Impression and Recommendations:     This case required medical decision making of moderate complexity. The above documentation has been reviewed and is accurate and complete Allison Pulley, DO       Note: This dictation was prepared with Dragon dictation along with smaller phrase technology. Any transcriptional errors that result from this process are unintentional.

## 2018-07-14 ENCOUNTER — Encounter: Payer: Self-pay | Admitting: Family Medicine

## 2018-07-14 ENCOUNTER — Ambulatory Visit: Payer: Managed Care, Other (non HMO) | Admitting: Family Medicine

## 2018-07-14 VITALS — BP 110/78 | HR 92 | Ht 65.0 in | Wt 126.0 lb

## 2018-07-14 DIAGNOSIS — M9902 Segmental and somatic dysfunction of thoracic region: Secondary | ICD-10-CM

## 2018-07-14 DIAGNOSIS — M533 Sacrococcygeal disorders, not elsewhere classified: Secondary | ICD-10-CM

## 2018-07-14 DIAGNOSIS — M9904 Segmental and somatic dysfunction of sacral region: Secondary | ICD-10-CM

## 2018-07-14 DIAGNOSIS — M9903 Segmental and somatic dysfunction of lumbar region: Secondary | ICD-10-CM

## 2018-07-14 DIAGNOSIS — M9901 Segmental and somatic dysfunction of cervical region: Secondary | ICD-10-CM | POA: Diagnosis not present

## 2018-07-14 DIAGNOSIS — M999 Biomechanical lesion, unspecified: Secondary | ICD-10-CM

## 2018-07-14 DIAGNOSIS — M9905 Segmental and somatic dysfunction of pelvic region: Secondary | ICD-10-CM

## 2018-07-14 DIAGNOSIS — M9908 Segmental and somatic dysfunction of rib cage: Secondary | ICD-10-CM

## 2018-07-14 NOTE — Assessment & Plan Note (Signed)
Decision today to treat with OMT was based on Physical Exam  After verbal consent patient was treated with HVLA, ME, FPR techniques in cervical, thoracic,rib, lumbar and sacral pelvis areas  Patient tolerated the procedure well with improvement in symptoms  Patient given exercises, stretches and lifestyle modifications  See medications in patient instructions if given  Patient will follow up in 4-8 weeks

## 2018-07-14 NOTE — Assessment & Plan Note (Signed)
Patient has been doing remarkably well overall.  Does not need injections in quite some time.  Responding well to osteopathic manipulation.  Discussed icing regimen.  Discussed topical anti-inflammatories.  Follow-up again in 4 to 8 weeks

## 2018-07-14 NOTE — Patient Instructions (Signed)
Overall you are doing great  Stay active Good luck with the move See em again in 3 weeks to be safe but if doing wel  Can move it back to 6ish weeks

## 2018-08-03 ENCOUNTER — Ambulatory Visit: Payer: Managed Care, Other (non HMO) | Admitting: Family Medicine

## 2018-08-03 ENCOUNTER — Encounter: Payer: Self-pay | Admitting: Family Medicine

## 2018-08-03 VITALS — BP 112/78 | HR 81 | Ht 65.0 in | Wt 120.0 lb

## 2018-08-03 DIAGNOSIS — M999 Biomechanical lesion, unspecified: Secondary | ICD-10-CM

## 2018-08-03 DIAGNOSIS — M810 Age-related osteoporosis without current pathological fracture: Secondary | ICD-10-CM

## 2018-08-03 DIAGNOSIS — M533 Sacrococcygeal disorders, not elsewhere classified: Secondary | ICD-10-CM | POA: Diagnosis not present

## 2018-08-03 DIAGNOSIS — M940 Chondrocostal junction syndrome [Tietze]: Secondary | ICD-10-CM | POA: Diagnosis not present

## 2018-08-03 NOTE — Assessment & Plan Note (Signed)
Decision today to treat with OMT was based on Physical Exam  After verbal consent patient was treated with HVLA, ME, FPR techniques in cervical, thoracic, rib, lumbar and sacral, pelvis areas  Patient tolerated the procedure well with improvement in symptoms  Patient given exercises, stretches and lifestyle modifications  See medications in patient instructions if given  Patient will follow up in 4-8 weeks 

## 2018-08-03 NOTE — Assessment & Plan Note (Signed)
Awaiting bone density.  May need bisphosphonate

## 2018-08-03 NOTE — Progress Notes (Signed)
[name] [name], [name] [address] Phone: [phone] Subjective:   [name], am serving as a scribe for Dr. Hulan Saas.  CC: Back pain follow-up  [id number]  Allison [name] is a 32 y.o. female coming in with complaint of upper back pain. Pain in axilla, rhomboids and scapula bilaterally. Denies any radiating symptoms. Walking the dog and sitting at desk at work makes it worse.  Patient states that it is a dull, throbbing aching sensation.  Lower back pain has improved but is still present. She did move recently and now that she is done moving lower back has improved but continues to have pain in upper back. Was using IBU before the move but has discontinue use.  Patient states that she has not been working out as regularly.  Does note that seems to be helpful.   Patient was found to have osteoporosis.  Awaiting new bone scan results before deciding the bisphosphonates.  Taking the vitamin D regularly  Past Medical History:  Diagnosis Date  . Chicken pox   . Heart murmur   . UTI (urinary tract infection)    Past Surgical History:  Procedure Laterality Date  . arthroscopic knee Right 2002  . WISDOM TOOTH EXTRACTION  2005   Social History   Socioeconomic History  . Marital status: Married    Spouse name: Not on file  . Number of children: Not on file  . Years of education: Not on file  . Highest education level: Not on file  Occupational History  . Occupation: optomerist    Comment: Works at [employer]  . Financial resource strain: Not on file  . Food insecurity:    Worry: Not on file    Inability: Not on file  . Transportation needs:    Medical: Not on file    Non-medical: Not on file  Tobacco Use  . Smoking status: Never Smoker  . Smokeless tobacco: Never Used  Substance and Sexual Activity  . Alcohol use: Yes    Alcohol/week: 2.0 standard drinks    Types: 2 Glasses of wine per week  . Drug use:  Never  . Sexual activity: Yes    Partners: Male  Lifestyle  . Physical activity:    Days per week: Not on file    Minutes per session: Not on file  . Stress: Not on file  Relationships  . Social connections:    Talks on phone: Not on file    Gets together: Not on file    Attends religious service: Not on file    Active member of club or organization: Not on file    Attends meetings of clubs or organizations: Not on file    Relationship status: Not on file  Other Topics Concern  . Not on file  Social History Narrative  . Not on file   Allergies  Allergen Reactions  . Morphine Rash    Age 46 said felt like skin burning tolerated hydrocodone in past    Family History  Problem Relation Age of Onset  . Depression Mother   . Miscarriages / [place] Mother   . High Cholesterol Father   . Early death Maternal Grandmother        Brain aneurysm   . Cancer Maternal Grandfather   . COPD Paternal Grandmother   . Heart attack Paternal Grandmother          Current Outpatient Medications (Other):  .  Calcium Carb-Cholecalciferol (CALCIUM-VITAMIN D) 500-200 MG-UNIT tablet, Take by mouth. .  cholecalciferol (VITAMIN D) 1000 units tablet, Take 2,000 Units by mouth daily.    Past medical history, social, surgical and family history all reviewed in electronic medical record.  No pertanent information unless stated regarding to the chief complaint.   Review of Systems:  No headache, visual changes, nausea, vomiting, diarrhea, constipation, dizziness, abdominal pain, skin rash, fevers, chills, night sweats, weight loss, swollen lymph nodes, body aches, joint swelling,  chest pain, shortness of breath, mood changes.  Mild positive muscle aches  Objective  Blood pressure 112/78, pulse 81, height 5\' 5"  (1.651 m), weight 120 lb (54.4 kg), SpO2 98 %.    General: No apparent distress alert and oriented x3 mood and affect normal, dressed appropriately.  HEENT: Pupils equal, extraocular  movements intact  Respiratory: Patient's speak in full sentences and does not appear short of breath  Cardiovascular: No lower extremity edema, non tender, no erythema  Skin: Warm dry intact with no signs of infection or rash on extremities or on axial skeleton.  Abdomen: Soft nontender  Neuro: Cranial nerves II through XII are intact, neurovascularly intact in all extremities with 2+ DTRs and 2+ pulses.  Lymph: No lymphadenopathy of posterior or anterior cervical chain or axillae bilaterally.  Gait normal with good balance and coordination.  MSK:  Non tender with full range of motion and good stability and symmetric strength and tone of shoulders, elbows, wrist, hip, knee and ankles bilaterally.  Neck: Inspection mild loss of lordosis. No palpable stepoffs. Negative Spurling's maneuver. Mild limitation with right-sided rotation and sidebending Grip strength and sensation normal in bilateral hands Strength good C4 to T1 distribution No sensory change to C4 to T1 Negative Hoffman sign bilaterally Reflexes normal Tightness of the right trapezius  Lower back mild loss of lordosis.  Patient still has positive Corky Sox test bilaterally bilaterally but negative straight leg test.  Mild discomfort in the paraspinal musculature.  +5 strength of the lower extremities deep tendon reflexes intact  Osteopathic findings  C4 flexed rotated and side bent right C6 flexed rotated and side bent left T3 extended rotated and side bent right inhaled third rib L4 flexed rotated and side bent left Sacrum right on right Right anterior illium     Impression and Recommendations:     This case required medical decision making of moderate complexity. The above documentation has been reviewed and is accurate and complete Lyndal Pulley, DO       Note: This dictation was prepared with Dragon dictation along with smaller phrase technology. Any transcriptional errors that result from this process are  unintentional.

## 2018-08-03 NOTE — Assessment & Plan Note (Signed)
Still some discomfort.  Discussed icing regimen and home excise patient will continue to be active.  Discussed again about posture and ergonomics.  Follow-up again in 4 to 8 weeks

## 2018-08-03 NOTE — Assessment & Plan Note (Signed)
The right side.  Once again talked about posture.  Patient has change ergonomics.  Patient will follow-up again in 4 to 8 weeks

## 2018-08-03 NOTE — Patient Instructions (Signed)
Good to see you  New homework  Calcium pyruvate 1500 mg daily  Iron 65mg  with 500mg  of vitamin C 3 days before menstration to 3 days after and see if that helps See me again in 4 weeks

## 2018-08-08 ENCOUNTER — Ambulatory Visit (INDEPENDENT_AMBULATORY_CARE_PROVIDER_SITE_OTHER): Payer: Managed Care, Other (non HMO) | Admitting: Family Medicine

## 2018-08-08 ENCOUNTER — Ambulatory Visit: Payer: Managed Care, Other (non HMO) | Admitting: Obstetrics and Gynecology

## 2018-08-08 ENCOUNTER — Other Ambulatory Visit: Payer: Self-pay

## 2018-08-08 ENCOUNTER — Other Ambulatory Visit (HOSPITAL_COMMUNITY)
Admission: RE | Admit: 2018-08-08 | Discharge: 2018-08-08 | Disposition: A | Discharge status: Home / Self Care | Payer: Managed Care, Other (non HMO) | Source: Ambulatory Visit | Attending: Family Medicine | Admitting: Family Medicine

## 2018-08-08 ENCOUNTER — Telehealth: Payer: Self-pay | Admitting: Emergency Medicine

## 2018-08-08 ENCOUNTER — Encounter: Payer: Self-pay | Admitting: Family Medicine

## 2018-08-08 ENCOUNTER — Encounter: Payer: Self-pay | Admitting: Obstetrics and Gynecology

## 2018-08-08 VITALS — BP 116/78 | Ht 65.0 in | Wt 124.0 lb

## 2018-08-08 VITALS — BP 110/74 | HR 90 | Temp 98.3°F | Ht 65.0 in | Wt 123.6 lb

## 2018-08-08 DIAGNOSIS — Z0000 Encounter for general adult medical examination without abnormal findings: Secondary | ICD-10-CM | POA: Diagnosis not present

## 2018-08-08 DIAGNOSIS — Z8781 Personal history of (healed) traumatic fracture: Secondary | ICD-10-CM | POA: Diagnosis not present

## 2018-08-08 DIAGNOSIS — Z3009 Encounter for other general counseling and advice on contraception: Secondary | ICD-10-CM

## 2018-08-08 DIAGNOSIS — M810 Age-related osteoporosis without current pathological fracture: Secondary | ICD-10-CM

## 2018-08-08 DIAGNOSIS — Z13220 Encounter for screening for lipoid disorders: Secondary | ICD-10-CM | POA: Diagnosis not present

## 2018-08-08 DIAGNOSIS — Z124 Encounter for screening for malignant neoplasm of cervix: Secondary | ICD-10-CM

## 2018-08-08 DIAGNOSIS — Z114 Encounter for screening for human immunodeficiency virus [HIV]: Secondary | ICD-10-CM

## 2018-08-08 LAB — LIPID PANEL
Cholesterol: 147 mg/dL (ref 0–200)
HDL: 65.3 mg/dL (ref >39.00)
LDL Cholesterol: 71 mg/dL (ref 0–99)
NonHDL: 81.88
Total CHOL/HDL Ratio: 2
Triglycerides: 55 mg/dL (ref 0.0–149.0)
VLDL: 11 mg/dL (ref 0.0–40.0)

## 2018-08-08 LAB — COMPREHENSIVE METABOLIC PANEL
ALT: 13 U/L (ref 0–35)
AST: 15 U/L (ref 0–37)
Albumin: 4.4 g/dL (ref 3.5–5.2)
Alkaline Phosphatase: 64 U/L (ref 39–117)
BUN: 13 mg/dL (ref 6–23)
CO2: 28 mEq/L (ref 19–32)
Calcium: 9.1 mg/dL (ref 8.4–10.5)
Chloride: 104 mEq/L (ref 96–112)
Creatinine, Ser: 0.71 mg/dL (ref 0.40–1.20)
GFR: 101.93 mL/min (ref >60.00)
Glucose, Bld: 100 mg/dL — ABNORMAL HIGH (ref 70–99)
Potassium: 3.6 mEq/L (ref 3.5–5.1)
Sodium: 139 mEq/L (ref 135–145)
Total Bilirubin: 0.7 mg/dL (ref 0.2–1.2)
Total Protein: 7.2 g/dL (ref 6.0–8.3)

## 2018-08-08 LAB — CBC WITH DIFFERENTIAL/PLATELET
Basophils Absolute: 0 10*3/uL (ref 0.0–0.1)
Basophils Relative: 0.8 % (ref 0.0–3.0)
Eosinophils Absolute: 0.1 10*3/uL (ref 0.0–0.7)
Eosinophils Relative: 2.4 % (ref 0.0–5.0)
HCT: 41.4 % (ref 36.0–46.0)
Hemoglobin: 14 g/dL (ref 12.0–15.0)
Lymphocytes Relative: 25.2 % (ref 12.0–46.0)
Lymphs Abs: 1.5 10*3/uL (ref 0.7–4.0)
MCHC: 33.8 g/dL (ref 30.0–36.0)
MCV: 84.2 fl (ref 78.0–100.0)
Monocytes Absolute: 0.5 10*3/uL (ref 0.1–1.0)
Monocytes Relative: 9.1 % (ref 3.0–12.0)
Neutro Abs: 3.6 10*3/uL (ref 1.4–7.7)
Neutrophils Relative %: 62.5 % (ref 43.0–77.0)
Platelets: 251 10*3/uL (ref 150.0–400.0)
RBC: 4.91 Mil/uL (ref 3.87–5.11)
RDW: 13.9 % (ref 11.5–15.5)
WBC: 5.8 10*3/uL (ref 4.0–10.5)

## 2018-08-08 LAB — VITAMIN D 25 HYDROXY (VIT D DEFICIENCY, FRACTURES): VITD: 38.4 ng/mL (ref 30.00–100.00)

## 2018-08-08 NOTE — Telephone Encounter (Signed)
Call to MFM and requested appointment at [phone].  They will contact patient to schedule consult.   Routing to [place] to follow up with referral.   Will close encounter.

## 2018-08-08 NOTE — Progress Notes (Signed)
Subjective:    Allison [name] is a 32 y.o. female and is here for a comprehensive physical exam.  No concerns today. Will be seeing Dr. Talbert Nan for IUD discussion. Married. 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 Recent Dx of osteoporosis with extensive work-up. Will consider medication after next DEXA.   Wanted children prior to OP diagnosis but now not sure.   Patient had positive ANA with Dr. Tamala Julian in 2018. She was evaluated by rheumatology with extensive blood work. Was informed it my be due to Psoriasis and did not need further work up unless new symptoms.    Current Outpatient Medications:  .  Calcium Carb-Cholecalciferol (CALCIUM-VITAMIN D) 500-200 MG-UNIT tablet, Take by mouth., Disp: , Rfl:  .  cholecalciferol (VITAMIN D) 1000 units tablet, Take 2,000 Units by mouth daily., Disp: , Rfl:   Health Maintenance Due  Topic Date Due  . HIV Screening  06/03/2002  . PAP SMEAR  06/03/2008  . INFLUENZA VACCINE  06/16/2018   PMHx, SurgHx, SocialHx, Medications, and Allergies were reviewed in the Visit Navigator and updated as appropriate.   Past Medical History:  Diagnosis Date  . Chicken pox   . Heart murmur   . History of lumbar compression fracture 06/27/2018   Compression fracture L2-L3 Dexa done 07/05/17 report in chart CT lumbar report from 04/22/17 in chart as well as xray from 04/19/17  . Lactose intolerance 07/11/2018   Age 63  . Osteoporosis, followed by Dr. Buddy Duty, thought to be due to Accutane 07/27/2017   Done density done 07/05/18 report in chart.   . Psoriasis 07/11/2018  . 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 KNEE ARTHROSCOPY Right   . WISDOM TOOTH EXTRACTION  2005     Family History  Problem Relation Age of Onset  . Depression Mother   . Miscarriages / [place] Mother   . High Cholesterol Father   .  Early death Maternal Grandmother        Brain aneurysm   . Cancer Maternal Grandfather   . COPD Paternal Grandmother   . Heart attack Paternal Grandmother     Social History   Tobacco Use  . Smoking status: Never Smoker  . Smokeless tobacco: Never Used  Substance Use Topics  . Alcohol use: Yes    Alcohol/week: 2.0 standard drinks    Types: 2 Glasses of wine per week  . Drug use: Never    Review of Systems:   Pertinent items are noted in the HPI. Otherwise, ROS is negative.  Objective:   There were no vitals taken for this visit.   General appearance: alert, cooperative and appears stated age. Head: normocephalic, without obvious abnormality, atraumatic. Neck: no adenopathy, supple, symmetrical, trachea midline; thyroid not enlarged, symmetric, no tenderness/mass/nodules. Lungs: clear to auscultation bilaterally. Heart: regular rate and rhythm Abdomen: soft, non-tender; no masses,  no organomegaly. Extremities: extremities normal, atraumatic, no cyanosis or edema. Skin: skin color, texture, turgor normal, no rashes or lesions. Lymph: cervical, supraclavicular, and axillary nodes normal; no abnormal inguinal nodes palpated. Neurologic: grossly normal.  Pelvic:  External genitalia: no lesions.              Urethra: normal appearing urethra with no masses, tenderness or lesions.              Bartholins and Skenes: normal.  Vagina: normal appearing vagina with normal color and discharge, no lesions.              Cervix: normal appearance.              Pap and high risk HPV testing done: Yes.          Bimanual Exam:   Uterus: uterus is normal size, shape, consistency and nontender.                                      Adnexa: normal adnexa in size, nontender and no masses.  Assessment/Plan:   Diagnoses and all orders for this visit:  Routine physical examination  Encounter for screening for HIV -     HIV Antibody (routine testing w rflx)  Screening for  lipid disorders -     Lipid panel  Osteoporosis, unspecified osteoporosis type, unspecified pathological fracture presence -     CBC with Differential/Platelet -     Comprehensive metabolic panel -     VITAMIN D 25 Hydroxy (Vit-D Deficiency, Fractures)  Pap smear for cervical cancer screening -     Cytology - PAP   Patient Counseling:   [x]     Nutrition: Stressed importance of moderation in sodium/caffeine intake, saturated fat and cholesterol, caloric balance, sufficient intake of fresh fruits, vegetables, fiber, calcium, iron, and 1 mg of folate supplement per day (for females capable of pregnancy).   [x]      Stressed the importance of regular exercise.    [x]     Substance Abuse: Discussed cessation/primary prevention of tobacco, alcohol, or other drug use; driving or other dangerous activities under the influence; availability of treatment for abuse.    [x]      Injury prevention: Discussed safety belts, safety helmets, smoke detector, smoking near bedding or upholstery.    [x]      Sexuality: Discussed sexually transmitted diseases, partner selection, use of condoms, avoidance of unintended pregnancy  and contraceptive alternatives.    [x]     Dental health: Discussed importance of regular tooth brushing, flossing, and dental visits.   [x]      Health maintenance and immunizations reviewed. Please refer to Health maintenance section.   Briscoe Deutscher, DO [place]

## 2018-08-08 NOTE — Telephone Encounter (Signed)
-----   Message from Salvadore Dom, MD sent at 08/08/2018  2:44 PM EDT ----- Please place a referral with MFM for a consultation for getting pregnant with h/o osteoporosis and h/o lumbar fracture. Thanks, Sharee Pimple
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
## 2018-08-09 LAB — CYTOLOGY - PAP
Diagnosis: NEGATIVE
HPV: NOT DETECTED

## 2018-08-09 LAB — HIV ANTIBODY (ROUTINE TESTING W REFLEX): HIV 1&2 Ab, 4th Generation: NONREACTIVE

## 2018-08-15 ENCOUNTER — Ambulatory Visit (HOSPITAL_COMMUNITY)
Admission: RE | Admit: 2018-08-15 | Discharge: 2018-08-15 | Disposition: A | Discharge status: Home / Self Care | Payer: Managed Care, Other (non HMO) | Source: Ambulatory Visit | Attending: Obstetrics and Gynecology | Admitting: Obstetrics and Gynecology

## 2018-08-15 ENCOUNTER — Encounter (HOSPITAL_COMMUNITY): Payer: Self-pay

## 2018-08-15 DIAGNOSIS — Z3169 Encounter for other general counseling and advice on procreation: Secondary | ICD-10-CM | POA: Diagnosis not present

## 2018-08-15 DIAGNOSIS — M810 Age-related osteoporosis without current pathological fracture: Secondary | ICD-10-CM | POA: Diagnosis not present

## 2018-08-15 NOTE — ED Notes (Signed)
Pt in for preconception consult.  BP 134/80, pulse 80, weight 127.2lb.  Pt in with Dr. Yates Decamp.

## 2018-08-15 NOTE — Consult Note (Signed)
Consultation:   Allison [name] is a 32 yo Caucasian female, G 0  P 0  LMP 09/ 03/2018 here for preconception counseling secondary to osteoporosis diagnosed in 2018.  Patient is currently taking Vitamin D and calcium.  She has discussed bisphosphonates in the past.    PREVIOUS OBSTETRICAL HISTORY:  NA     PREVIOUS GYN HISTORY:   Abnormal PAP - 2011 - colposcopy and F/U   GC - neg   Chlamydia - neg    Syphilis - neg   CAT - 13 x 26-28 x 5-7   Contraception - condoms    PREVIOUS MEDICAL HISTORY:  DM - neg   HTN - neg   Asthma - neg   Thyroid - neg   Rheumatic Fever - neg    Heart - neg   Lung - neg   Liver - neg   Kidney -cyst on each kidney   Epilepsy - neg    TB - neg   Herpes - neg   UTI - neg        PREVIOUS SURGICAL HISTORY:  1) 2004 - Right knee arthroscopy without complications 2) 3875 - [facility]                                                                             MEDICATIONS:  Calcium 500 mg QD Vitamin D 2000 IU QD       ALLERGIES/REACTIONS:  Morphine - skin reaction      HABITS:  Smoking - neg   Drinking - neg   Drugs - neg    PSYCHOSOCIAL:  Married     PROFESSION:  Optometrist    FAMILY HISTORY:  DM - neg   HTN - neg   Twins - neg   Stillborns -neg    Birth Defects - neg   Mental Retardation - neg   Blood Dyscrasias - neg   Anesthesia Complications - neg    Genetic - neg          OSTEOPOROSIS AND PREGNANCY:  General counseling was then performed regarding Osteoporosis and pregnancy.  Pregnancy is not a contraindication for women desirous of becoming pregnant.  Adjustment of some medications may be necessary.  Calcium and Vitamin D are the mainstays of treatment in pregnancy. Many medications such as bi-phosphonates and immunologic agents may need to be stopped during pregnancy.   IMPRESSIONS:  1) Probable mild osteoporosis    RECOMMENDATIONS:  1) Full treatment prior to becoming pregnant             45 minutes spent in face-to-face  consultation with greater than 50% of the time spent in counseling.    Thank you for utilizing our ultrasound and consultative services.  If I may be of any further service, please do not hesitate to contact me.  Sincerely,   Lajoyce Lauber, MD Maternal-Fetal Medicine   Copy of report sent to practitioner/clinic.

## 2018-08-16 ENCOUNTER — Telehealth: Payer: Self-pay | Admitting: Obstetrics and Gynecology

## 2018-08-16 DIAGNOSIS — Z3009 Encounter for other general counseling and advice on contraception: Secondary | ICD-10-CM

## 2018-08-16 NOTE — Telephone Encounter (Signed)
Spoke with patient. Patient request to proceed with [place] IUD insertion. Discussed at [facility] on 08/08/18. LMP 07/21/18. No contraceptive.   Advised will place order for IUD insertion for precert. Our office will return call to review benefits. Instructed patient to return call to office to schedule with next menses. Will review with Dr. Talbert Nan and return call with any additional recommendations. Patient verbalizes understanding.   Order pended for IUD insertion.   Dr. Talbert Nan -ok to proceed with IUD insertion?    Cc: Lerry Liner, Magdalene Patricia

## 2018-08-16 NOTE — Telephone Encounter (Signed)
Left message to call Lynnelle Mesmer at [phone].  

## 2018-08-16 NOTE — Telephone Encounter (Signed)
Patient returned call

## 2018-08-16 NOTE — Telephone Encounter (Signed)
Patient would like to schedule IUD insertion. °

## 2018-08-17 NOTE — Telephone Encounter (Signed)
Yes, a paragard is fine, order signed.

## 2018-09-03 NOTE — Progress Notes (Signed)
[name] [name], [place] [address] Phone: [phone] Subjective:    I Allison [name] am serving as a Education administrator for Dr. Hulan Saas.   CC: Back pain follow-up  [id number]  Allison [name] is a 32 y.o. female coming in with complaint of back pain. States that her back feels about the same. Patient does have some scapular dyskinesis as well as posture ergonomics the patient has been working on.  Potential underlying autoimmune disease still contributing.  Patient does have psoriasis but had been ruled out.  Does have osteoporosis of unknown significance.  Bone density though did show the patient had worsening symptoms at this time.  Patient is wanting to know if she can have potential further evaluation at a [facility] educational.  Patient will be talking to her endocrinologist about this.    Past Medical History:  Diagnosis Date  . Chicken pox   . Heart murmur   . History of lumbar compression fracture 06/27/2018   Compression fracture L2-L3 Dexa done 07/05/17 report in chart CT lumbar report from 04/22/17 in chart as well as xray from 04/19/17  . Lactose intolerance 07/11/2018   Age 25  . Osteoporosis, followed by Dr. Buddy Duty, thought to be due to Accutane 07/27/2017   Done density done 07/05/18 report in chart.   . Psoriasis 07/11/2018  . 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 KNEE ARTHROSCOPY Right   . WISDOM TOOTH EXTRACTION  2005   Social History   Socioeconomic History  . Marital status: Married    Spouse name: Not on file  . Number of children: 0  . Years of education: Not on file  . Highest education level: Not on file  Occupational History  . Occupation: Human resources officer: [employer]  . Financial resource strain: Not  on file  . Food insecurity:    Worry: Not on file    Inability: Not on file  . Transportation needs:    Medical: Not on file    Non-medical: Not on file  Tobacco Use  . Smoking status: Never Smoker  . Smokeless tobacco: Never Used  Substance and Sexual Activity  . Alcohol use: Yes    Alcohol/week: 2.0 standard drinks    Types: 2 Glasses of wine per week    Comment: twice a month  . Drug use: Never  . Sexual activity: Yes    Partners: Male    Birth control/protection: Rhythm, Condom  Lifestyle  . Physical activity:    Days per week: Not on file    Minutes per session: Not on file  . Stress: Not on file  Relationships  . Social connections:    Talks on phone: Not on file    Gets together: Not on file    Attends religious service: Not on file    Active member of club or organization: Not on file    Attends meetings of clubs or organizations: Not on file    Relationship status: Not on file  Other Topics Concern  . Not on file  Social History Narrative  . Not on file   Allergies  Allergen Reactions  . Morphine Rash    Age 25 said felt like skin burning tolerated hydrocodone in past    Family History  Problem Relation Age  of Onset  . Depression Mother   . Miscarriages / [place] Mother   . High Cholesterol Father   . Early death Maternal Grandmother        Brain aneurysm   . Cancer Maternal Grandfather   . COPD Paternal Grandmother   . Heart attack Paternal Grandmother          Current Outpatient Medications (Other):  Marland Kitchen  Calcium Carb-Cholecalciferol (CALCIUM-VITAMIN D) 500-200 MG-UNIT tablet, Take by mouth. .  cholecalciferol (VITAMIN D) 1000 units tablet, Take 2,000 Units by mouth daily.    Past medical history, social, surgical and family history all reviewed in electronic medical record.  No pertanent information unless stated regarding to the chief complaint.   Review of Systems:  No headache, visual changes, nausea, vomiting, diarrhea,  constipation, dizziness, abdominal pain, skin rash, fevers, chills, night sweats, weight loss, swollen lymph nodes, , joint swelling,  chest pain, shortness of breath, mood changes.  Mild positive muscle aches, body aches  Objective  Blood pressure 106/72, pulse 95, height 5\' 5"  (1.651 m), weight 130 lb (59 kg), SpO2 98 %.    General: No apparent distress alert and oriented x3 mood and affect normal, dressed appropriately.  HEENT: Pupils equal, extraocular movements intact  Respiratory: Patient's speak in full sentences and does not appear short of breath  Cardiovascular: No lower extremity edema, non tender, no erythema  Skin: Warm dry intact with no signs of infection or rash on extremities or on axial skeleton.  Abdomen: Soft nontender  Neuro: Cranial nerves II through XII are intact, neurovascularly intact in all extremities with 2+ DTRs and 2+ pulses.  Lymph: No lymphadenopathy of posterior or anterior cervical chain or axillae bilaterally.  Gait normal with good balance and coordination.  MSK:  Non tender with full range of motion and good stability and symmetric strength and tone of shoulders, elbows, wrist, hip, knee and ankles bilaterally.  Patient's back exam has tightness between the shoulder blades.  Also tightness around the sacroiliac joint.  Mild positive Corky Sox on the left side.  Negative straight leg test.  Mild diffuse tenderness to palpation of the paraspinal musculature.  Osteopathic findings C2 flexed rotated and side bent right C4 flexed rotated and side bent left C6 flexed rotated and side bent left T3 extended rotated and side bent right inhaled third rib T7 extended rotated and side bent left     Impression and Recommendations:     This case required medical decision making of moderate complexity. The above documentation has been reviewed and is accurate and complete Lyndal Pulley, DO       Note: This dictation was prepared with Dragon dictation along  with smaller phrase technology. Any transcriptional errors that result from this process are unintentional.

## 2018-09-05 ENCOUNTER — Ambulatory Visit: Payer: Managed Care, Other (non HMO) | Admitting: Family Medicine

## 2018-09-05 ENCOUNTER — Encounter: Payer: Self-pay | Admitting: Family Medicine

## 2018-09-05 VITALS — BP 106/72 | HR 95 | Ht 65.0 in | Wt 130.0 lb

## 2018-09-05 DIAGNOSIS — M940 Chondrocostal junction syndrome [Tietze]: Secondary | ICD-10-CM

## 2018-09-05 DIAGNOSIS — M999 Biomechanical lesion, unspecified: Secondary | ICD-10-CM | POA: Diagnosis not present

## 2018-09-05 DIAGNOSIS — L409 Psoriasis, unspecified: Secondary | ICD-10-CM

## 2018-09-05 NOTE — Patient Instructions (Addendum)
Good to see you  Iron any way you want it but take with vitamin C.  Will take a couple weeks to see the effect  Ask about prolia as well  Keep me updated See me again in 4-6 weeks

## 2018-09-05 NOTE — Assessment & Plan Note (Signed)
History of psoriasis, concern for an underlying psoriatic arthritis that could be contributing to patient's osteoporosis.  Patient will consider this.  May need treatment as well.  We will discuss a follow-up in 4 to 8 weeks.

## 2018-09-05 NOTE — Assessment & Plan Note (Signed)
Decision today to treat with OMT was based on Physical Exam  After verbal consent patient was treated with HVLA, ME, FPR techniques in cervical, thoracic, rib areas  Patient tolerated the procedure well with improvement in symptoms  Patient given exercises, stretches and lifestyle modifications  See medications in patient instructions if given  Patient will follow up in 4-8 weeks 

## 2018-09-05 NOTE — Assessment & Plan Note (Signed)
Appears the patient continues to have more difficulty with a slipped rib syndrome.  We discussed icing regimen and home exercise.  Discussed the iron deficiency could be playing a role as well.  We discussed vitamin D supplementation could be beneficial.  Continue the other medications at this moment.  Follow-up with me again in 4 to 8 weeks.

## 2018-09-13 ENCOUNTER — Encounter: Payer: Self-pay | Admitting: Obstetrics and Gynecology

## 2018-09-13 ENCOUNTER — Encounter: Payer: Self-pay | Admitting: Family Medicine

## 2018-09-13 ENCOUNTER — Telehealth: Payer: Self-pay | Admitting: Obstetrics and Gynecology

## 2018-09-13 MED ORDER — CYCLOBENZAPRINE HCL 5 MG PO TABS: 5.0000 mg | ORAL_TABLET | Freq: Three times a day (TID) | ORAL | 0 refills | Status: DC | PRN

## 2018-09-13 NOTE — Telephone Encounter (Signed)
Patient sent the following message through [facility]. Routing to triage to assist patient with request.   Hi Dr. Joycelyn Rua,    Just got the recent results of my bone density scan and it showed a 9% decline in my lumbar spine compared to last year and thus I am being referred to Dr. Delane Ginger [facility]) and will likely be considering osteoporotic treatment, etc.     In light of that, I would also like to get her insight into whether she thinks hormones (whether in IUD form or oral contraceptives) would be beneficial for my bone health. I will the proceed from there with the Paragard, or not.     Wanted to keep you updated and ask if there is anything you need me to do in regards to all of this?    Thank you!  Allison [name]

## 2018-09-13 NOTE — Telephone Encounter (Signed)
Please let the patient know that in women who have amenorrhea and  low estrogen levels are frequently put on OCP's to help protect their bones. Given that she is having regular cycles I wouldn't expect the pill would make much of a difference. Please have Dr Truddie Coco send a copy of her office note here.

## 2018-09-13 NOTE — Telephone Encounter (Signed)
Routing to Dr. Jertson to review and advise.  

## 2018-09-14 NOTE — Telephone Encounter (Signed)
Spoke with patient, advised as seen below per Dr. Talbert Nan. Patient will call when ready to proceed with Paragard, is aware will need to call with start of menses. Patient verbalizes understanding and is agreeable. Encounter closed.

## 2018-09-14 NOTE — Telephone Encounter (Signed)
Left message to call Coe Angelos, RN at [facility] [phone].   

## 2018-10-16 NOTE — Progress Notes (Signed)
[name] [address] [place], [place] [address] Phone: [phone] Subjective:   Allison [name], am serving as a scribe for Dr. Hulan Saas.   CC: Back pain  [id number]  Allison [name] is a 32 y.o. female coming in with complaint of back pain. She has pain in thoracic spine pain. She has pain into the deltoid and tricep bilaterally. She thought that pulling on a heavy door at work caused her pain but the pain came back after not pulling on the door at work as much.  Patient does have possible autoimmune disease.  Does have the osteopenia and is seen another provider.  Going to be repeating the bone density test at a later date.  She also is having right sided SI joint pain today. Is here for OMT to manage her lower back pain.        Past Medical History:  Diagnosis Date  . Chicken pox   . Heart murmur   . History of lumbar compression fracture 06/27/2018   Compression fracture L2-L3 Dexa done 07/05/17 report in chart CT lumbar report from 04/22/17 in chart as well as xray from 04/19/17  . Lactose intolerance 07/11/2018   Age 17  . Osteoporosis, followed by Dr. Buddy Duty, thought to be due to Accutane 07/27/2017   Done density done 07/05/18 report in chart.   . Psoriasis 07/11/2018  . 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 KNEE ARTHROSCOPY Right   . WISDOM TOOTH EXTRACTION  2005   Social History   Socioeconomic History  . Marital status: Married    Spouse name: Not on file  . Number of children: 0  . Years of education: Not on file  . Highest education level: Not on file  Occupational History  . Occupation: Human resources officer: [employer]  . Financial resource strain: Not on file  . Food insecurity:    Worry: Not on file    Inability:  Not on file  . Transportation needs:    Medical: Not on file    Non-medical: Not on file  Tobacco Use  . Smoking status: Never Smoker  . Smokeless tobacco: Never Used  Substance and Sexual Activity  . Alcohol use: Yes    Alcohol/week: 2.0 standard drinks    Types: 2 Glasses of wine per week    Comment: twice a month  . Drug use: Never  . Sexual activity: Yes    Partners: Male    Birth control/protection: Rhythm, Condom  Lifestyle  . Physical activity:    Days per week: Not on file    Minutes per session: Not on file  . Stress: Not on file  Relationships  . Social connections:    Talks on phone: Not on file    Gets together: Not on file    Attends religious service: Not on file    Active member of club or organization: Not on file    Attends meetings of clubs or organizations: Not on file    Relationship status: Not on file  Other Topics Concern  . Not on file  Social History Narrative  . Not on file   Allergies  Allergen Reactions  . Morphine Rash    Age 17 said felt like skin burning tolerated hydrocodone in past  Family History  Problem Relation Age of Onset  . Depression Mother   . Miscarriages / [place] Mother   . High Cholesterol Father   . Early death Maternal Grandmother        Brain aneurysm   . Cancer Maternal Grandfather   . COPD Paternal Grandmother   . Heart attack Paternal Grandmother          Current Outpatient Medications (Other):  Marland Kitchen  Calcium Carb-Cholecalciferol (CALCIUM-VITAMIN D) 500-200 MG-UNIT tablet, Take by mouth. .  cholecalciferol (VITAMIN D) 1000 units tablet, Take 2,000 Units by mouth daily. .  cyclobenzaprine (FLEXERIL) 5 MG tablet, Take 1 tablet (5 mg total) by mouth 3 (three) times daily as needed for muscle spasms.    Past medical history, social, surgical and family history all reviewed in electronic medical record.  [name] unless stated regarding to the chief complaint.   Review of Systems:  [name]  headache, visual changes, nausea, vomiting, diarrhea, constipation, dizziness, abdominal pain, skin rash, fevers, chills, night sweats, weight loss, swollen lymph nodes, body aches, joint swelling, muscle aches, chest pain, shortness of breath, mood changes.   Objective  Blood pressure 100/76, pulse (!) 105, height 5\' 5"  (1.651 m), weight 130 lb (59 kg), SpO2 98 %. Systems examined below as of   General: [name] apparent distress alert and oriented x3 mood and affect normal, dressed appropriately.  HEENT: Pupils equal, extraocular movements intact  Respiratory: Patient's speak in full sentences and does not appear short of breath  Cardiovascular: [name] lower extremity edema, non tender, [name] erythema  Skin: Warm dry intact with [name] signs of infection or rash on extremities or on axial skeleton.  Abdomen: Soft nontender  Neuro: Cranial nerves II through XII are intact, neurovascularly intact in all extremities with 2+ DTRs and 2+ pulses.  Lymph: [name] lymphadenopathy of posterior or anterior cervical chain or axillae bilaterally.  Gait normal with good balance and coordination.  MSK:  Non tender with full range of motion and good stability and symmetric strength and tone of shoulders, elbows, wrist, hip, knee and ankles bilaterally.  Back Exam:  Inspection: Mild loss of lordosis Motion: Flexion 45 deg, Extension 45 deg, Side Bending to 45 deg bilaterally,  Rotation to 45 deg bilaterally  SLR laying: Negative  XSLR laying: Negative  Palpable tenderness: Tender to palpation in the paraspinal musculature.  Some on the parascapular region on the right side in the left sacroiliac joint. FABER: Positive left. Sensory change: Gross sensation intact to all lumbar and sacral dermatomes.  Reflexes: 2+ at both patellar tendons, 2+ at achilles tendons, Babinski's downgoing.  Strength at foot  Plantar-flexion: 5/5 Dorsi-flexion: 5/5 Eversion: 5/5 Inversion: 5/5  Leg strength  Quad: 5/5 Hamstring: 5/5 Hip flexor: 5/5  Hip abductors: 5/5   Osteopathic findings  C2 flexed rotated and side bent right C6 flexed rotated and side bent left T3 extended rotated and side bent right inhaled third rib T6 extended rotated and side bent left L4 flexed rotated and side bent left Sacrum right on right Pelvic shear noted     Impression and Recommendations:     This case required medical decision making of moderate complexity. The above documentation has been reviewed and is accurate and complete Lyndal Pulley, DO       Note: This dictation was prepared with Dragon dictation along with smaller phrase technology. Any transcriptional errors that result from this process are unintentional.

## 2018-10-17 ENCOUNTER — Ambulatory Visit: Payer: 59 | Admitting: Family Medicine

## 2018-10-17 ENCOUNTER — Encounter: Payer: Self-pay | Admitting: Family Medicine

## 2018-10-17 VITALS — BP 100/76 | HR 105 | Ht 65.0 in | Wt 130.0 lb

## 2018-10-17 DIAGNOSIS — M533 Sacrococcygeal disorders, not elsewhere classified: Secondary | ICD-10-CM | POA: Diagnosis not present

## 2018-10-17 DIAGNOSIS — M999 Biomechanical lesion, unspecified: Secondary | ICD-10-CM

## 2018-10-17 DIAGNOSIS — M9981 Other biomechanical lesions of cervical region: Secondary | ICD-10-CM | POA: Diagnosis not present

## 2018-10-17 NOTE — Assessment & Plan Note (Signed)
Decision today to treat with OMT was based on Physical Exam  After verbal consent patient was treated with HVLA, ME, FPR techniques in cervical, thoracic, rib, lumbar and sacral, pelvic areas  Patient tolerated the procedure well with improvement in symptoms  Patient given exercises, stretches and lifestyle modifications  See medications in patient instructions if given  Patient will follow up in 6-12 weeks

## 2018-10-17 NOTE — Assessment & Plan Note (Signed)
Continues to respond.  Concerned that there is psoriatic arthritis that is contributing to some of the discomfort and pain.  Discussed icing regimen, core strengthening, hip abductor strengthening.  Which activities to do which wants to avoid.  Discussed with patient about icing regimen.  Patient will continue with conservative therapy and see me again in 2 to 3 months.

## 2018-10-17 NOTE — Patient Instructions (Signed)
Good to see you  Allison [name] is your friend Stay active  \Happy holidays!  See me again In 6-8 weeks

## 2018-10-18 ENCOUNTER — Encounter: Payer: Self-pay | Admitting: Family Medicine

## 2018-11-01 ENCOUNTER — Other Ambulatory Visit: Payer: Self-pay | Admitting: *Deleted

## 2018-11-01 DIAGNOSIS — M999 Biomechanical lesion, unspecified: Secondary | ICD-10-CM

## 2018-11-16 ENCOUNTER — Emergency Department (HOSPITAL_BASED_OUTPATIENT_CLINIC_OR_DEPARTMENT_OTHER): Payer: 59

## 2018-11-16 ENCOUNTER — Other Ambulatory Visit: Payer: Self-pay

## 2018-11-16 ENCOUNTER — Encounter (HOSPITAL_BASED_OUTPATIENT_CLINIC_OR_DEPARTMENT_OTHER): Payer: Self-pay

## 2018-11-16 ENCOUNTER — Emergency Department (HOSPITAL_BASED_OUTPATIENT_CLINIC_OR_DEPARTMENT_OTHER)
Admission: EM | Admit: 2018-11-16 | Discharge: 2018-11-16 | Disposition: A | Discharge status: Home / Self Care | Payer: 59 | Attending: Emergency Medicine | Admitting: Emergency Medicine

## 2018-11-16 DIAGNOSIS — S9032XA Contusion of left foot, initial encounter: Secondary | ICD-10-CM | POA: Diagnosis not present

## 2018-11-16 DIAGNOSIS — Y929 Unspecified place or not applicable: Secondary | ICD-10-CM | POA: Insufficient documentation

## 2018-11-16 DIAGNOSIS — Z79899 Other long term (current) drug therapy: Secondary | ICD-10-CM | POA: Diagnosis not present

## 2018-11-16 DIAGNOSIS — Y999 Unspecified external cause status: Secondary | ICD-10-CM | POA: Insufficient documentation

## 2018-11-16 DIAGNOSIS — W228XXA Striking against or struck by other objects, initial encounter: Secondary | ICD-10-CM | POA: Insufficient documentation

## 2018-11-16 DIAGNOSIS — Y939 Activity, unspecified: Secondary | ICD-10-CM | POA: Insufficient documentation

## 2018-11-16 DIAGNOSIS — S99922A Unspecified injury of left foot, initial encounter: Secondary | ICD-10-CM | POA: Diagnosis present

## 2018-11-16 NOTE — ED Notes (Signed)
ED Provider at bedside. 

## 2018-11-16 NOTE — ED Provider Notes (Signed)
[facility] Provider Note   CSN: [id number] Arrival date & time: 11/16/18  1828     History   Chief Complaint Chief Complaint  Patient presents with  . Foot Injury    HPI Allison [name] is a 33 y.o. female.  Patient is a 33 year old female with no significant medical problems currently coming in today because of injury to her left third toe that occurred 2 days ago.  She was walking in hit it on something.  It immediately bruised and was tender.  She was in a wedding yesterday but was able to wear flip-flops.  However the pain has persisted and now she is having more pain in her foot and bruising.  More painful to walk and better with rest and elevation.  No ankle pain.  The history is provided by the patient.    Past Medical History:  Diagnosis Date  . Chicken pox   . Heart murmur   . History of lumbar compression fracture 06/27/2018   Compression fracture L2-L3 Dexa done 07/05/17 report in chart CT lumbar report from 04/22/17 in chart as well as xray from 04/19/17  . Lactose intolerance 07/11/2018   Age 44  . Osteoporosis, followed by Dr. Buddy Duty, thought to be due to Accutane 07/27/2017   Done density done 07/05/18 report in chart.   . Psoriasis 07/11/2018  . 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 Heart murmur 07/11/2018  . Lactose intolerance 07/11/2018  . Psoriasis 07/11/2018  . Family history of thyroid disease 07/11/2018  . History of lumbar compression fracture 06/27/2018  . 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 Nonallopathic lesion of cervical region 05/30/2018  . Nonallopathic lesion of rib cage 05/30/2018  . Nonallopathic lesion of thoracic region 11/01/2017  . Osteoporosis, followed by Dr. Buddy Duty, thought to be due to Accutane 07/27/2017  . SI (sacroiliac) joint dysfunction  07/12/2017  . Nonallopathic lesion of lumbosacral region 07/12/2017  . Nonallopathic lesion of sacral region 07/12/2017  . Nonallopathic lesion of pelvic region 07/12/2017    Past Surgical History:  Procedure Laterality Date  . KNEE ARTHROSCOPY Right   . WISDOM TOOTH EXTRACTION  2005     OB History    Gravida  0   Para  0   Term  0   Preterm  0   AB  0   Living  0     SAB  0   TAB  0   Ectopic  0   Multiple  0   Live Births  0            Home Medications    Prior to Admission medications   Medication Sig Start Date End Date Taking? Authorizing Provider  Calcium Carb-Cholecalciferol (CALCIUM-VITAMIN D) 500-200 MG-UNIT tablet Take by mouth.    [provider]  cholecalciferol (VITAMIN D) 1000 units tablet Take 2,000 Units by mouth daily.    [provider]  cyclobenzaprine (FLEXERIL) 5 MG tablet Take 1 tablet (5 mg total) by mouth 3 (three) times daily as needed for muscle spasms. 09/13/18   Briscoe Deutscher, DO    Family History Family History  Problem Relation Age of Onset  . Depression Mother   . Miscarriages / [place] Mother   . High Cholesterol Father   . Early death Maternal Grandmother  Brain aneurysm   . Cancer Maternal Grandfather   . COPD Paternal Grandmother   . Heart attack Paternal Grandmother     Social History Social History   Tobacco Use  . Smoking status: Never Smoker  . Smokeless tobacco: Never Used  Substance Use Topics  . Alcohol use: Yes    Alcohol/week: 2.0 standard drinks    Types: 2 Glasses of wine per week    Comment: twice a month  . Drug use: Never     Allergies   Morphine   Review of Systems Review of Systems  All other systems reviewed and are negative.    Physical Exam Updated Vital Signs BP 134/90 (BP Location: Right Arm)   Pulse 80   Temp 98.4 F (36.9 C) (Oral)   Resp 18   Ht 5\' 5"  (1.651 m)   Wt 61.2 kg   LMP 11/05/2018   SpO2 100%   BMI 22.47 kg/m    Physical Exam Vitals signs and nursing note reviewed.  Constitutional:      General: She is not in acute distress.    Appearance: Normal appearance. She is normal weight.  HENT:     Head: Normocephalic.  Cardiovascular:     Rate and Rhythm: Normal rate.  Pulmonary:     Effort: Pulmonary effort is normal.  Musculoskeletal:        General: Swelling and tenderness present.     Left ankle: Normal.       Feet:  Skin:    General: Skin is warm.     Capillary Refill: Capillary refill takes less than 2 seconds.  Neurological:     Mental Status: She is alert. Mental status is at baseline.  Psychiatric:        Mood and Affect: Mood normal.        Behavior: Behavior normal.      ED Treatments / Results  Labs (all labs ordered are listed, but only abnormal results are displayed) Labs Reviewed - No data to display  EKG None  Radiology Dg Foot Complete Left  Result Date: 11/16/2018 CLINICAL DATA:  Foot injury EXAM: LEFT FOOT - COMPLETE 3+ VIEW COMPARISON:  None. FINDINGS: There is no evidence of fracture or dislocation. There is no evidence of arthropathy or other focal bone abnormality. Soft tissues are unremarkable. IMPRESSION: Negative. Electronically Signed   By: Donavan Foil M.D.   On: 11/16/2018 19:21    Procedures Procedures (including critical care time)  Medications Ordered in ED Medications - No data to display   Initial Impression / Assessment and Plan / ED Course  I have reviewed the triage vital signs and the nursing notes.  Pertinent labs & imaging results that were available during my care of the patient were reviewed by me and considered in my medical decision making (see chart for details).     Patient presenting with bruising and swelling to her left third toe concern for possible fracture.  X-ray neg and pt treated for contusion  Final Clinical Impressions(s) / ED Diagnoses   Final diagnoses:  Contusion of left foot, initial encounter    ED  Discharge Orders    None       Blanchie Dessert, MD 11/16/18 1928

## 2018-11-16 NOTE — ED Triage Notes (Signed)
Pt states she injured left foot 2 dasy-bruising noted-NAD-steady gait

## 2018-11-26 NOTE — Progress Notes (Signed)
[name] [address], [place] [address] Phone: [phone] Subjective:    I Allison [name] am serving as a Education administrator for Dr. Hulan Saas.   I'm seeing this patient by the request  of:    CC: Back pain follow-up  [id number]  Allison [name] is a 33 y.o. female coming in with complaint of back pain. Dry needling today. Left foot contusion. States she bumped her toe on a banister on her stairs.   Onset- New years eve Location- ball of the foot (3rd toe) Character- achy Aggravating factors- walking Reliving factors- Ice, heat, Ibuprofen Therapies tried-  Severity-5 out of 10   History significant for psoriasis as well as history of compression fracture.    Past Medical History:  Diagnosis Date  . Chicken pox   . Heart murmur   . History of lumbar compression fracture 06/27/2018   Compression fracture L2-L3 Dexa done 07/05/17 report in chart CT lumbar report from 04/22/17 in chart as well as xray from 04/19/17  . Lactose intolerance 07/11/2018   Age 64  . Osteoporosis, followed by Dr. Buddy Duty, thought to be due to Accutane 07/27/2017   Done density done 07/05/18 report in chart.   . Psoriasis 07/11/2018  . 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 KNEE ARTHROSCOPY Right   . WISDOM TOOTH EXTRACTION  2005   Social History   Socioeconomic History  . Marital status: Married    Spouse name: Not on file  . Number of children: 0  . Years of education: Not on file  . Highest education level: Not on file  Occupational History  . Occupation: Human resources officer: [employer]  . Financial resource strain: Not on file  . Food insecurity:    Worry: Not on file    Inability: Not on file  . Transportation needs:    Medical: Not on file   Non-medical: Not on file  Tobacco Use  . Smoking status: Never Smoker  . Smokeless tobacco: Never Used  Substance and Sexual Activity  . Alcohol use: Yes    Alcohol/week: 2.0 standard drinks    Types: 2 Glasses of wine per week    Comment: twice a month  . Drug use: Never  . Sexual activity: Yes    Partners: Male    Birth control/protection: Rhythm, Condom  Lifestyle  . Physical activity:    Days per week: Not on file    Minutes per session: Not on file  . Stress: Not on file  Relationships  . Social connections:    Talks on phone: Not on file    Gets together: Not on file    Attends religious service: Not on file    Active member of club or organization: Not on file    Attends meetings of clubs or organizations: Not on file    Relationship status: Not on file  Other Topics Concern  . Not on file  Social History Narrative  . Not on file   Allergies  Allergen Reactions  . Morphine Rash    Age 64 said felt like skin burning tolerated hydrocodone in past    Family History  Problem Relation Age of Onset  . Depression Mother   . Miscarriages / [place] Mother   . High Cholesterol Father   .  Early death Maternal Grandmother        Brain aneurysm   . Cancer Maternal Grandfather   . COPD Paternal Grandmother   . Heart attack Paternal Grandmother          Current Outpatient Medications (Other):  Marland Kitchen  Calcium Carb-Cholecalciferol (CALCIUM-VITAMIN D) 500-200 MG-UNIT tablet, Take by mouth. .  cholecalciferol (VITAMIN D) 1000 units tablet, Take 2,000 Units by mouth daily. .  cyclobenzaprine (FLEXERIL) 5 MG tablet, Take 1 tablet (5 mg total) by mouth 3 (three) times daily as needed for muscle spasms.    Past medical history, social, surgical and family history all reviewed in electronic medical record.  No pertanent information unless stated regarding to the chief complaint.   Review of Systems:  No headache, visual changes, nausea, vomiting, diarrhea, constipation,  dizziness, abdominal pain, skin rash, fevers, chills, night sweats, weight loss, swollen lymph nodes, body aches, joint swelling, muscle aches, chest pain, shortness of breath, mood changes.   Objective  Blood pressure 100/70, pulse 85, height 5\' 5"  (1.651 m), weight 132 lb (59.9 kg), last menstrual period 11/05/2018, SpO2 99 %.    General: No apparent distress alert and oriented x3 mood and affect normal, dressed appropriately.  HEENT: Pupils equal, extraocular movements intact  Respiratory: Patient's speak in full sentences and does not appear short of breath  Cardiovascular: No lower extremity edema, non tender, no erythema  Skin: Warm dry intact with no signs of infection or rash on extremities or on axial skeleton.  Abdomen: Soft nontender  Neuro: Cranial nerves II through XII are intact, neurovascularly intact in all extremities with 2+ DTRs and 2+ pulses.  Lymph: No lymphadenopathy of posterior or anterior cervical chain or axillae bilaterally.  Gait normal with good balance and coordination.  MSK:  Non tender with full range of motion and good stability and symmetric strength and tone of shoulders, elbows, wrist, hip, knee and ankles bilaterally.  Mild hypermobility noted.  No swelling of the joints today Back Exam:  Inspection: Unremarkable  Motion: Flexion 45 deg, Extension 25 deg, Side Bending to 35 deg bilaterally,  Rotation to 45 deg bilaterally  SLR laying: Negative  XSLR laying: Negative  Palpable tenderness: Tender to palpation in the paraspinal musculature diffusely in the lumbar spine. FABER: negative. Sensory change: Gross sensation intact to all lumbar and sacral dermatomes.  Reflexes: 2+ at both patellar tendons, 2+ at achilles tendons, Babinski's downgoing.  Strength at foot  Plantar-flexion: 5/5 Dorsi-flexion: 5/5 Eversion: 5/5 Inversion: 5/5  Leg strength  Quad: 5/5 Hamstring: 5/5 Hip flexor: 5/5 Hip abductors: 5/5  Gait unremarkable.   Exam of the left foot  shows that patient does have resolving contusion.  Mild discomfort over the midfoot but patient is able to bear weight.  Able to jump up and down 5 times.  Full range of motion of the ankle.  Osteopathic findings C2 flexed rotated and side bent right C4 flexed rotated and side bent left C6 flexed rotated and side bent left T3 extended rotated and side bent right inhaled third rib T9 extended rotated and side bent left L2 flexed rotated and side bent right Sacrum right on right    Impression and Recommendations:     This case required medical decision making of moderate complexity. The above documentation has been reviewed and is accurate and complete Lyndal Pulley, DO       Note: This dictation was prepared with Dragon dictation along with smaller phrase technology. Any transcriptional errors that result  from this process are unintentional.

## 2018-11-28 ENCOUNTER — Encounter: Payer: Self-pay | Admitting: Family Medicine

## 2018-11-28 ENCOUNTER — Ambulatory Visit: Payer: 59 | Admitting: Family Medicine

## 2018-11-28 VITALS — BP 100/70 | HR 85 | Ht 65.0 in | Wt 132.0 lb

## 2018-11-28 DIAGNOSIS — S9030XA Contusion of unspecified foot, initial encounter: Secondary | ICD-10-CM | POA: Insufficient documentation

## 2018-11-28 DIAGNOSIS — M533 Sacrococcygeal disorders, not elsewhere classified: Secondary | ICD-10-CM | POA: Diagnosis not present

## 2018-11-28 DIAGNOSIS — M999 Biomechanical lesion, unspecified: Secondary | ICD-10-CM

## 2018-11-28 NOTE — Assessment & Plan Note (Signed)
Decision today to treat with OMT was based on Physical Exam  After verbal consent patient was treated with HVLA, ME, FPR techniques in cervical, thoracic, rib,  lumbar and sacral areas  Patient tolerated the procedure well with improvement in symptoms  Patient given exercises, stretches and lifestyle modifications  See medications in patient instructions if given  Patient will follow up in 4-8 weeks 

## 2018-11-28 NOTE — Patient Instructions (Signed)
Overall doing awesome Arnica lotion to the foot 2 times a day  Heat instead of ice may help as well  Compression socks a little more regularly  Solid sole shoes in the house See me again in 6-12 weeks!

## 2018-11-28 NOTE — Assessment & Plan Note (Signed)
No sign of deformity.  Resolving contusion.  Continue rigid sole shoes.  Follow-up again in 4 to 6 weeks

## 2018-11-28 NOTE — Assessment & Plan Note (Signed)
Continues to have some difficulty.  Been over a year since patient has a new knee injection.  Responding well to the osteopathic manipulation.  Discussed icing regimen, home exercise, vitamin D supplementation.  Follow-up with me again in 4 to 8 weeks

## 2019-01-22 NOTE — Progress Notes (Signed)
[name] [address] [place], [place] [address] Phone: [phone] Subjective:     [name], am serving as a scribe for Dr. Hulan Saas.\   CC: Back and neck pain follow-up with polyarthralgia follow-up  [id number]  Allison [name] is a 33 y.o. female coming in with complaint of back pain. She has started dry needling and PT which is helping decrease her tightness.  Patient is happy with the result so far.  Patient states that the pain never stops her from activity.  Patient also notes that her left foot bothers her by the end of her day. Is having pain over the metatarsal heads. Does feel like her pain is worse when wearing dress shoes.  Rates the severity of pain is 5 out of 10         Past Medical History:  Diagnosis Date  . Chicken pox   . Heart murmur   . History of lumbar compression fracture 06/27/2018   Compression fracture L2-L3 Dexa done 07/05/17 report in chart CT lumbar report from 04/22/17 in chart as well as xray from 04/19/17  . Lactose intolerance 07/11/2018   Age 67  . Osteoporosis, followed by Dr. Buddy Duty, thought to be due to Accutane 07/27/2017   Done density done 07/05/18 report in chart.   . Psoriasis 07/11/2018  . 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 KNEE ARTHROSCOPY Right   . WISDOM TOOTH EXTRACTION  2005   Social History   Socioeconomic History  . Marital status: Married    Spouse name: Not on file  . Number of children: 0  . Years of education: Not on file  . Highest education level: Not on file  Occupational History  . Occupation: Human resources officer: [employer]  . Financial resource strain: Not on file  . Food insecurity:    Worry: Not on file    Inability: Not on file  . Transportation needs:   Medical: Not on file    Non-medical: Not on file  Tobacco Use  . Smoking status: Never Smoker  . Smokeless tobacco: Never Used  Substance and Sexual Activity  . Alcohol use: Yes    Alcohol/week: 2.0 standard drinks    Types: 2 Glasses of wine per week    Comment: twice a month  . Drug use: Never  . Sexual activity: Yes    Partners: Male    Birth control/protection: Rhythm, Condom  Lifestyle  . Physical activity:    Days per week: Not on file    Minutes per session: Not on file  . Stress: Not on file  Relationships  . Social connections:    Talks on phone: Not on file    Gets together: Not on file    Attends religious service: Not on file    Active member of club or organization: Not on file    Attends meetings of clubs or organizations: Not on file    Relationship status: Not on file  Other Topics Concern  . Not on file  Social History Narrative  . Not on file   Allergies  Allergen Reactions  . Morphine Rash    Age 67 said felt like skin burning tolerated hydrocodone in past    Family History  Problem Relation Age of Onset  .  Depression Mother   . Miscarriages / [place] Mother   . High Cholesterol Father   . Early death Maternal Grandmother        Brain aneurysm   . Cancer Maternal Grandfather   . COPD Paternal Grandmother   . Heart attack Paternal Grandmother          Current Outpatient Medications (Other):  Marland Kitchen  Calcium Carb-Cholecalciferol (CALCIUM-VITAMIN D) 500-200 MG-UNIT tablet, Take by mouth. .  cholecalciferol (VITAMIN D) 1000 units tablet, Take 2,000 Units by mouth daily. .  cyclobenzaprine (FLEXERIL) 5 MG tablet, Take 1 tablet (5 mg total) by mouth 3 (three) times daily as needed for muscle spasms.    Past medical history, social, surgical and family history all reviewed in electronic medical record.  No pertanent information unless stated regarding to the chief complaint.   Review of Systems:  No headache, visual changes, nausea, vomiting,  diarrhea, constipation, dizziness, abdominal pain, skin rash, fevers, chills, night sweats, weight loss, swollen lymph nodes, body aches, joint swelling, chest pain, shortness of breath, mood changes.  Positive muscle aches  Objective  Blood pressure 108/76, pulse 95, height 5\' 5"  (1.651 m), weight 136 lb (61.7 kg), SpO2 98 %.    General: No apparent distress alert and oriented x3 mood and affect normal, dressed appropriately.  HEENT: Pupils equal, extraocular movements intact  Respiratory: Patient's speak in full sentences and does not appear short of breath  Cardiovascular: No lower extremity edema, non tender, no erythema  Skin: Warm dry intact with no signs of infection or rash on extremities or on axial skeleton.  Abdomen: Soft nontender  Neuro: Cranial nerves II through XII are intact, neurovascularly intact in all extremities with 2+ DTRs and 2+ pulses.  Lymph: No lymphadenopathy of posterior or anterior cervical chain or axillae bilaterally.  Gait normal with good balance and coordination.  MSK:  Non tender with full range of motion and good stability and symmetric strength and tone of shoulders, elbows, wrist, hip, knee and ankles bilaterally.  Mild hypermobility Neck: Inspection mild loss of lordosis. No palpable stepoffs. Negative Spurling's maneuver. Full neck range of motion Grip strength and sensation normal in bilateral hands Strength good C4 to T1 distribution No sensory change to C4 to T1 Negative Hoffman sign bilaterally Reflexes normal  Back Exam:  Inspection: Unremarkable  Motion: Flexion 45 deg, Extension 25 deg, Side Bending to 35 deg bilaterally,  Rotation to 45 deg bilaterally  SLR laying: Negative  XSLR laying: Negative  Palpable tenderness: Tender over the left sacroiliac joint. FABER: Positive left. Sensory change: Gross sensation intact to all lumbar and sacral dermatomes.  Reflexes: 2+ at both patellar tendons, 2+ at achilles tendons, Babinski's  downgoing.  Strength at foot  Plantar-flexion: 5/5 Dorsi-flexion: 5/5 Eversion: 5/5 Inversion: 5/5  Leg strength  Quad: 5/5 Hamstring: 5/5 Hip flexor: 5/5 Hip abductors: 5/5  Gait unremarkable.  Osteopathic findings  C2 flexed rotated and side bent right C6 flexed rotated and side bent left T3 extended rotated and side bent right inhaled third rib L2 flexed rotated and side bent right Sacrum left on left    Impression and Recommendations:     This case required medical decision making of moderate complexity. The above documentation has been reviewed and is accurate and complete Lyndal Pulley, DO       Note: This dictation was prepared with Dragon dictation along with smaller phrase technology. Any transcriptional errors that result from this process are unintentional.

## 2019-01-23 ENCOUNTER — Encounter: Payer: Self-pay | Admitting: Family Medicine

## 2019-01-23 ENCOUNTER — Ambulatory Visit: Payer: 59 | Admitting: Family Medicine

## 2019-01-23 VITALS — BP 108/76 | HR 95 | Ht 65.0 in | Wt 136.0 lb

## 2019-01-23 DIAGNOSIS — M999 Biomechanical lesion, unspecified: Secondary | ICD-10-CM

## 2019-01-23 DIAGNOSIS — M533 Sacrococcygeal disorders, not elsewhere classified: Secondary | ICD-10-CM | POA: Diagnosis not present

## 2019-01-23 NOTE — Assessment & Plan Note (Signed)
Decision today to treat with OMT was based on Physical Exam  After verbal consent patient was treated with HVLA, ME, FPR techniques in cervical, thoracic, rib lumbar and sacral areas  Patient tolerated the procedure well with improvement in symptoms  Patient given exercises, stretches and lifestyle modifications  See medications in patient instructions if given  Patient will follow up in 4-8 weeks 

## 2019-01-23 NOTE — Patient Instructions (Signed)
Great to see you as usual Stay active Keep trucking along.  Glad the dry needling is helping.  Keep it up  See me again in 6-7 weeks

## 2019-01-23 NOTE — Assessment & Plan Note (Signed)
Continues to have some difficulty.  Discussed icing regimen and home exercises.  Which activities to do which wants to avoid.  Patient is to increase activity slowly over the course of time.  Discussed icing regimen.  Follow-up again in 4 to 8 weeks

## 2019-01-25 ENCOUNTER — Telehealth: Payer: Self-pay | Admitting: Obstetrics and Gynecology

## 2019-01-25 NOTE — Telephone Encounter (Signed)
Benefits was conveyed to patient for [employer]. Patient to call back at the onset of her cycle. To date patient has not returned a call for scheduling. Please advise on how to proceed.  [employer]

## 2019-01-25 NOTE — Telephone Encounter (Signed)
Please cancel order.

## 2019-03-06 ENCOUNTER — Encounter: Payer: Self-pay | Admitting: Family Medicine

## 2019-03-06 ENCOUNTER — Ambulatory Visit: Payer: 59 | Admitting: Family Medicine

## 2019-03-06 ENCOUNTER — Other Ambulatory Visit: Payer: Self-pay

## 2019-03-06 VITALS — BP 110/72 | HR 99 | Ht 65.0 in | Wt 135.0 lb

## 2019-03-06 DIAGNOSIS — M533 Sacrococcygeal disorders, not elsewhere classified: Secondary | ICD-10-CM | POA: Diagnosis not present

## 2019-03-06 DIAGNOSIS — M940 Chondrocostal junction syndrome [Tietze]: Secondary | ICD-10-CM | POA: Diagnosis not present

## 2019-03-06 DIAGNOSIS — M999 Biomechanical lesion, unspecified: Secondary | ICD-10-CM

## 2019-03-06 NOTE — Progress Notes (Signed)
[name] [address] [place], [place] [address] Phone: [phone] Subjective:    Allison [name], am serving as a scribe for Dr. Hulan Saas.   CC: Back pain follow-up  [id number]  Allison [name] is a 33 y.o. female coming in with complaint of neck back  pain.  Past medical history significant for psoriatic arthritis.  Has been doing  relatively well overall.  Discussed icing regimen and home exercise.  Discussed topical anti-inflammatories. Patient has been doing fairly well overall.    Past Medical History:  Diagnosis Date  . Chicken pox   . Heart murmur   . History of lumbar compression fracture 06/27/2018   Compression fracture L2-L3 Dexa done 07/05/17 report in chart CT lumbar report from 04/22/17 in chart as well as xray from 04/19/17  . Lactose intolerance 07/11/2018   Age 58  . Osteoporosis, followed by Dr. Buddy Duty, thought to be due to Accutane 07/27/2017   Done density done 07/05/18 report in chart.   . Psoriasis 07/11/2018  . 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 KNEE ARTHROSCOPY Right   . WISDOM TOOTH EXTRACTION  2005   Social History   Socioeconomic History  . Marital status: Married    Spouse name: Not on file  . Number of children: 0  . Years of education: Not on file  . Highest education level: Not on file  Occupational History  . Occupation: Human resources officer: [employer]  . Financial resource strain: Not on file  . Food insecurity:    Worry: Not on file    Inability: Not on file  . Transportation needs:    Medical: Not on file    Non-medical: Not on file  Tobacco Use  . Smoking status: Never Smoker  . Smokeless tobacco: Never Used  Substance and Sexual Activity  . Alcohol use: Yes    Alcohol/week: 2.0  standard drinks    Types: 2 Glasses of wine per week    Comment: twice a month  . Drug use: Never  . Sexual activity: Yes    Partners: Male    Birth control/protection: Rhythm, Condom  Lifestyle  . Physical activity:    Days per week: Not on file    Minutes per session: Not on file  . Stress: Not on file  Relationships  . Social connections:    Talks on phone: Not on file    Gets together: Not on file    Attends religious service: Not on file    Active member of club or organization: Not on file    Attends meetings of clubs or organizations: Not on file    Relationship status: Not on file  Other Topics Concern  . Not on file  Social History Narrative  . Not on file   Allergies  Allergen Reactions  . Morphine Rash    Age 58 said felt like skin burning tolerated hydrocodone in past    Family History  Problem Relation Age of Onset  . Depression Mother   . Miscarriages / [place] Mother   . High Cholesterol Father   . Early death Maternal Grandmother        Brain aneurysm   . Cancer Maternal Grandfather   . COPD Paternal Grandmother   . Heart attack Paternal Grandmother  Current Outpatient Medications (Other):  Marland Kitchen  Calcium Carb-Cholecalciferol (CALCIUM-VITAMIN D) 500-200 MG-UNIT tablet, Take by mouth. .  cholecalciferol (VITAMIN D) 1000 units tablet, Take 2,000 Units by mouth daily. .  cyclobenzaprine (FLEXERIL) 5 MG tablet, Take 1 tablet (5 mg total) by mouth 3 (three) times daily as needed for muscle spasms.    Past medical history, social, surgical and family history all reviewed in electronic medical record.  [name] unless stated regarding to the chief complaint.   Review of Systems:  [name] headache, visual changes, nausea, vomiting, diarrhea, constipation, dizziness, abdominal pain, skin rash, fevers, chills, night sweats, weight loss, swollen lymph nodes, body aches, joint swelling, muscle aches, chest pain, shortness of breath, mood  changes.   Objective  There were [name] vitals taken for this visit. Systems examined below as of    General: [name] apparent distress alert and oriented x3 mood and affect normal, dressed appropriately.  HEENT: Pupils equal, extraocular movements intact  Respiratory: Patient's speak in full sentences and does not appear short of breath  Cardiovascular: [name] lower extremity edema, non tender, [name] erythema  Skin: Warm dry intact with [name] signs of infection or rash on extremities or on axial skeleton.  Abdomen: Soft nontender  Neuro: Cranial nerves II through XII are intact, neurovascularly intact in all extremities with 2+ DTRs and 2+ pulses.  Lymph: [name] lymphadenopathy of posterior or anterior cervical chain or axillae bilaterally.  Gait normal with good balance and coordination.  MSK:  Non tender with full range of motion and good stability and symmetric strength and tone of shoulders, elbows, wrist, hip, knee and ankles bilaterally.  Back Exam:  Inspection: Loss of lordosis Motion: Flexion 40 deg, Extension 25 deg, Side Bending to 35 deg bilaterally,  Rotation to 35 deg bilaterally  SLR laying: Negative  XSLR laying: Negative  Palpable tenderness: Tender to palpation of paraspinal musculature lumbar spine. FABER: Positive Faber. Sensory change: Gross sensation intact to all lumbar and sacral dermatomes.  Reflexes: 2+ at both patellar tendons, 2+ at achilles tendons, Babinski's downgoing.  Strength at foot  Plantar-flexion: 5/5 Dorsi-flexion: 5/5 Eversion: 5/5 Inversion: 5/5  Leg strength  Quad: 5/5 Hamstring: 5/5 Hip flexor: 5/5 Hip abductors: 5/5    Osteopathic findings C2 flexed rotated and side bent right C4 flexed rotated and side bent left C6 flexed rotated and side bent left T3 extended rotated and side bent right inhaled third rib T9 extended rotated and side bent left L2 flexed rotated and side bent right Sacrum right on right    Impression and Recommendations:     This case  required medical decision making of moderate complexity. The above documentation has been reviewed and is accurate and complete Lyndal Pulley, DO       Note: This dictation was prepared with Dragon dictation along with smaller phrase technology. Any transcriptional errors that result from this process are unintentional.

## 2019-03-06 NOTE — Assessment & Plan Note (Signed)
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  Discussed with patient in great length, discussed home exercise, icing regimen, which activities to do which wants to avoid.  Discussed posture and ergonomics.  Patient given more hip flexor stretches for the sacroiliac joint.  Follow-up again in 4 to 6 weeks

## 2019-03-06 NOTE — Patient Instructions (Signed)
Good to see you.  One knee down one knee up tilt pelvis forward.  Hands overhead then rotate to upper leg.  Hold 10 seconds, relax, repeat and do other side as well.  Very important when after being in a flexed position for a long amount of time.  Up to you on the PT  Keep working on the posture See me again in 4-6 weeks Stay safe and thanks for the HBO tip

## 2019-03-06 NOTE — Assessment & Plan Note (Signed)
Decision today to treat with OMT was based on Physical Exam  After verbal consent patient was treated with HVLA, ME, FPR techniques in cervical, thoracic, rib lumbar and sacral areas  Patient tolerated the procedure well with improvement in symptoms  Patient given exercises, stretches and lifestyle modifications  See medications in patient instructions if given  Patient will follow up in 4-6 weeks 

## 2019-04-16 NOTE — Progress Notes (Signed)
[name] [place] [place], [place] [address] Phone: [phone] Subjective:      CC: neck pain follow up   [id number]  Allison [name] is a 33 y.o. female coming in with complaint of neck and back pain follow-up.  Patient has been seen previously and diagnosed with slipped rib syndrome and sacroiliac dysfunction. History of osteoporosis as well and since we have met her diagnosis of psoriasis.  Patient states she is doing well.  Patient has been doing relatively well but is sitting more in a computer at the moment.  Patient is starting to go into the phase of increasing office visits.  Concerned that would likely cause some discomfort.  Patient is seeing physical therapy 1 time a month.     Past Medical History:  Diagnosis Date  . Chicken pox   . Heart murmur   . History of lumbar compression fracture 06/27/2018   Compression fracture L2-L3 Dexa done 07/05/17 report in chart CT lumbar report from 04/22/17 in chart as well as xray from 04/19/17  . Lactose intolerance 07/11/2018   Age 33  . Osteoporosis, followed by Dr. Buddy Duty, thought to be due to Accutane 07/27/2017   Done density done 07/05/18 report in chart.   . Psoriasis 07/11/2018  . 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 KNEE ARTHROSCOPY Right   . WISDOM TOOTH EXTRACTION  2005   Social History   Socioeconomic History  . Marital status: Married    Spouse name: Not on file  . Number of children: 0  . Years of education: Not on file  . Highest education level: Not on file  Occupational History  . Occupation: Human resources officer: [employer]  . Financial resource strain: Not on file  . Food insecurity:    Worry: Not on file    Inability: Not on file  . Transportation needs:   Medical: Not on file    Non-medical: Not on file  Tobacco Use  . Smoking status: Never Smoker  . Smokeless tobacco: Never Used  Substance and Sexual Activity  . Alcohol use: Yes    Alcohol/week: 2.0 standard drinks    Types: 2 Glasses of wine per week    Comment: twice a month  . Drug use: Never  . Sexual activity: Yes    Partners: Male    Birth control/protection: Rhythm, Condom  Lifestyle  . Physical activity:    Days per week: Not on file    Minutes per session: Not on file  . Stress: Not on file  Relationships  . Social connections:    Talks on phone: Not on file    Gets together: Not on file    Attends religious service: Not on file    Active member of club or organization: Not on file    Attends meetings of clubs or organizations: Not on file    Relationship status: Not on file  Other Topics Concern  . Not on file  Social History Narrative  . Not on file   Allergies  Allergen Reactions  . Morphine Rash    Age 33 said felt like skin burning tolerated hydrocodone in past    Family History  Problem Relation Age of Onset  . Depression Mother   . Miscarriages / [place] Mother   .  High Cholesterol Father   . Early death Maternal Grandmother        Brain aneurysm   . Cancer Maternal Grandfather   . COPD Paternal Grandmother   . Heart attack Paternal Grandmother          Current Outpatient Medications (Other):  Marland Kitchen  Calcium Carb-Cholecalciferol (CALCIUM-VITAMIN D) 500-200 MG-UNIT tablet, Take by mouth. .  cholecalciferol (VITAMIN D) 1000 units tablet, Take 2,000 Units by mouth daily. .  cyclobenzaprine (FLEXERIL) 5 MG tablet, Take 1 tablet (5 mg total) by mouth 3 (three) times daily as needed for muscle spasms.    Past medical history, social, surgical and family history all reviewed in electronic medical record.  No pertanent information unless stated regarding to the chief complaint.   Review of Systems:  No headache, visual changes, nausea, vomiting,  diarrhea, constipation, dizziness, abdominal pain, skin rash, fevers, chills, night sweats, weight loss, swollen lymph nodes, body aches, joint swelling, muscle aches, chest pain, shortness of breath, mood changes.  Mild positive muscle aches  Objective  Blood pressure 96/70, pulse 81, height '5\' 5"'  (1.651 m), weight 135 lb (61.2 kg), SpO2 98 %.   General: No apparent distress alert and oriented x3 mood and affect normal, dressed appropriately.  HEENT: Pupils equal, extraocular movements intact  Respiratory: Patient's speak in full sentences and does not appear short of breath  Cardiovascular: No lower extremity edema, non tender, no erythema  Skin: Warm dry intact with no signs of infection or rash on extremities or on axial skeleton.  Abdomen: Soft nontender  Neuro: Cranial nerves II through XII are intact, neurovascularly intact in all extremities with 2+ DTRs and 2+ pulses.  Lymph: No lymphadenopathy of posterior or anterior cervical chain or axillae bilaterally.  Gait normal with good balance and coordination.  MSK:  Non tender with full range of motion and good stability and symmetric strength and tone of shoulders, elbows, wrist, hip, knee and ankles bilaterally.  Neck: Inspection loss of lordosis. No palpable stepoffs. Negative Spurling's maneuver.  Grip strength and sensation normal in bilateral hands Strength good C4 to T1 distribution No sensory change to C4 to T1 Negative Hoffman sign bilaterally Reflexes normal  Back Exam:  Inspection: Unremarkable  Motion: Flexion 45 deg, Extension 45 deg, Side Bending to 45 deg bilaterally,  Rotation to 45 deg bilaterally  SLR laying: Negative  XSLR laying: Negative  Palpable tenderness: severe left SI joint . FABER: positive left . Sensory change: Gross sensation intact to all lumbar and sacral dermatomes.  Reflexes: 2+ at both patellar tendons, 2+ at achilles tendons, Babinski's downgoing.  Strength at foot  Plantar-flexion: 5/5  Dorsi-flexion: 5/5 Eversion: 5/5 Inversion: 5/5  Leg strength  Quad: 5/5 Hamstring: 5/5 Hip flexor: 5/5 Hip abductors: 5/5  Gait unremarkable.  Osteopathic findings C2 flexed rotated and side bent right C6 flexed rotated and side bent left T3 extended rotated and side bent right inhaled third rib T9 extended rotated and side bent left L2 flexed rotated and side bent right Sacrum right on right     Impression and Recommendations:     This case required medical decision making of moderate complexity. The above documentation has been reviewed and is accurate and complete Lyndal Pulley, DO       Note: This dictation was prepared with Dragon dictation along with smaller phrase technology. Any transcriptional errors that result from this process are unintentional.

## 2019-04-17 ENCOUNTER — Ambulatory Visit (INDEPENDENT_AMBULATORY_CARE_PROVIDER_SITE_OTHER): Payer: 59 | Admitting: Family Medicine

## 2019-04-17 ENCOUNTER — Other Ambulatory Visit: Payer: Self-pay

## 2019-04-17 ENCOUNTER — Encounter: Payer: Self-pay | Admitting: Family Medicine

## 2019-04-17 VITALS — BP 96/70 | HR 81 | Ht 65.0 in | Wt 135.0 lb

## 2019-04-17 DIAGNOSIS — M940 Chondrocostal junction syndrome [Tietze]: Secondary | ICD-10-CM | POA: Diagnosis not present

## 2019-04-17 DIAGNOSIS — M999 Biomechanical lesion, unspecified: Secondary | ICD-10-CM | POA: Diagnosis not present

## 2019-04-17 NOTE — Patient Instructions (Signed)
Keep monitor at eye level See you in 8 weeks

## 2019-04-17 NOTE — Assessment & Plan Note (Signed)
Slipped rib syndrome.  Patient did have more tightness around the sacroiliac joint.  Responded well to manipulation.  Discussed icing regimen and home exercises.  Patient will follow-up with me again in 4 to 8 weeks.

## 2019-04-17 NOTE — Assessment & Plan Note (Signed)
Decision today to treat with OMT was based on Physical Exam  After verbal consent patient was treated with HVLA, ME, FPR techniques in cervical, thoracic, rib,  lumbar and sacral areas  Patient tolerated the procedure well with improvement in symptoms  Patient given exercises, stretches and lifestyle modifications  See medications in patient instructions if given  Patient will follow up in 4-8 weeks 

## 2019-05-08 ENCOUNTER — Ambulatory Visit (INDEPENDENT_AMBULATORY_CARE_PROVIDER_SITE_OTHER): Payer: 59 | Admitting: Psychology

## 2019-05-08 DIAGNOSIS — F4323 Adjustment disorder with mixed anxiety and depressed mood: Secondary | ICD-10-CM

## 2019-05-15 ENCOUNTER — Ambulatory Visit: Payer: 59 | Admitting: Family Medicine

## 2019-05-29 ENCOUNTER — Ambulatory Visit (INDEPENDENT_AMBULATORY_CARE_PROVIDER_SITE_OTHER): Payer: 59 | Admitting: Psychology

## 2019-05-29 DIAGNOSIS — F4323 Adjustment disorder with mixed anxiety and depressed mood: Secondary | ICD-10-CM

## 2019-06-11 NOTE — Assessment & Plan Note (Signed)
Patient has been doing well.  Has had bilateral sacroiliac injections.  Responded very well to conservative therapy including formal physical therapy and osteopathic manipulation.  Follow-up again in 4 to 8 weeks

## 2019-06-11 NOTE — Assessment & Plan Note (Signed)
Decision today to treat with OMT was based on Physical Exam  After verbal consent patient was treated with HVLA, ME, FPR techniques in cervical, thoracic, rib lumbar and sacral areas  Patient tolerated the procedure well with improvement in symptoms  Patient given exercises, stretches and lifestyle modifications  See medications in patient instructions if given  Patient will follow up in 4-8 weeks 

## 2019-06-11 NOTE — Progress Notes (Signed)
[name] [place], [place] [address] Phone: [phone] Subjective:   I [name] am serving as a Education administrator for Dr. Hulan Saas.  I'm seeing this patient by the request  of:    CC: Back pain follow-up  CBU:[id number]  Allison [name] is a 33 y.o. female coming in with complaint of back pain. States she feels about the same. Occiput area is giving her some issues.  Patient has been doing relatively well overall.  Some increased stress in 1 3 nuclear prepared of the neck.      Past Medical History:  Diagnosis Date  . Chicken pox   . Heart murmur   . History of lumbar compression fracture 06/27/2018   Compression fracture L2-L3 Dexa done 07/05/17 report in chart CT lumbar report from 04/22/17 in chart as well as xray from 04/19/17  . Lactose intolerance 07/11/2018   Age 45  . Osteoporosis, followed by Dr. Buddy Duty, thought to be due to Accutane 07/27/2017   Done density done 07/05/18 report in chart.   . Psoriasis 07/11/2018  . 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 KNEE ARTHROSCOPY Right   . WISDOM TOOTH EXTRACTION  2005   Social History   Socioeconomic History  . Marital status: Married    Spouse name: Not on file  . Number of children: 0  . Years of education: Not on file  . Highest education level: Not on file  Occupational History  . Occupation: Human resources officer: [employer]  . Financial resource strain: Not on file  . Food insecurity    Worry: Not on file    Inability: Not on file  . Transportation needs    Medical: Not on file    Non-medical: Not on file  Tobacco Use  . Smoking status: Never Smoker  . Smokeless tobacco: Never Used  Substance and Sexual Activity  . Alcohol use: Yes    Alcohol/week: 2.0 standard drinks     Types: 2 Glasses of wine per week    Comment: twice a month  . Drug use: Never  . Sexual activity: Yes    Partners: Male    Birth control/protection: Rhythm, Condom  Lifestyle  . Physical activity    Days per week: Not on file    Minutes per session: Not on file  . Stress: Not on file  Relationships  . Social Herbalist on phone: Not on file    Gets together: Not on file    Attends religious service: Not on file    Active member of club or organization: Not on file    Attends meetings of clubs or organizations: Not on file    Relationship status: Not on file  Other Topics Concern  . Not on file  Social History Narrative  . Not on file   Allergies  Allergen Reactions  . Morphine Rash    Age 45 said felt like skin burning tolerated hydrocodone in past    Family History  Problem Relation Age of Onset  . Depression Mother   . Miscarriages / [place] Mother   . High Cholesterol Father   . Early death Maternal Grandmother        Brain aneurysm   . Cancer Maternal Grandfather   . COPD Paternal  Grandmother   . Heart attack Paternal Grandmother          Current Outpatient Medications (Other):  Marland Kitchen  Calcium Carb-Cholecalciferol (CALCIUM-VITAMIN D) 500-200 MG-UNIT tablet, Take by mouth. .  cholecalciferol (VITAMIN D) 1000 units tablet, Take 2,000 Units by mouth daily. .  cyclobenzaprine (FLEXERIL) 5 MG tablet, Take 1 tablet (5 mg total) by mouth 3 (three) times daily as needed for muscle spasms.    Past medical history, social, surgical and family history all reviewed in electronic medical record.  No pertanent information unless stated regarding to the chief complaint.   Review of Systems:  No headache, visual changes, nausea, vomiting, diarrhea, constipation, dizziness, abdominal pain, skin rash, fevers, chills, night sweats, weight loss, swollen lymph nodes, body aches, joint swelling,  chest pain, shortness of breath, mood changes.  Positive muscle aches   Objective  Blood pressure 100/60, pulse 89, height 5\' 5"  (1.651 m), weight 131 lb (59.4 kg), SpO2 98 %.   General: No apparent distress alert and oriented x3 mood and affect normal, dressed appropriately.  HEENT: Pupils equal, extraocular movements intact  Respiratory: Patient's speak in full sentences and does not appear short of breath  Cardiovascular: No lower extremity edema, non tender, no erythema  Skin: Warm dry intact with no signs of infection or rash on extremities or on axial skeleton.  Abdomen: Soft nontender  Neuro: Cranial nerves II through XII are intact, neurovascularly intact in all extremities with 2+ DTRs and 2+ pulses.  Lymph: No lymphadenopathy of posterior or anterior cervical chain or axillae bilaterally.  Gait normal with good balance and coordination.  MSK:  Non tender with full range of motion and good stability and symmetric strength and tone of shoulders, elbows, wrist, hip, knee and ankles bilaterally.  Back Exam:  Inspection: Unremarkable  Motion: Flexion 40 deg, Extension 25 deg, Side Bending to 35 deg bilaterally,  Rotation to 45 deg bilaterally  SLR laying: Negative  XSLR laying: Negative  Palpable tenderness: Mild tenderness in the thoracolumbar junction. FABER: negative. Sensory change: Gross sensation intact to all lumbar and sacral dermatomes.  Reflexes: 2+ at both patellar tendons, 2+ at achilles tendons, Babinski's downgoing.  Strength at foot  Plantar-flexion: 5/5 Dorsi-flexion: 5/5 Eversion: 5/5 Inversion: 5/5  Leg strength  Quad: 5/5 Hamstring: 5/5 Hip flexor: 5/5 Hip abductors: 5/5  Gait unremarkable.  Osteopathic findings C2 flexed rotated and side bent left C6 flexed rotated and side bent left T3 extended rotated and side bent right inhaled third rib T7 extended rotated and side bent left L2 flexed rotated and side bent right Sacrum right on right    Impression and Recommendations:     This case required medical decision making  of moderate complexity. The above documentation has been reviewed and is accurate and complete Lyndal Pulley, DO       Note: This dictation was prepared with Dragon dictation along with smaller phrase technology. Any transcriptional errors that result from this process are unintentional.

## 2019-06-12 ENCOUNTER — Encounter: Payer: Self-pay | Admitting: Family Medicine

## 2019-06-12 ENCOUNTER — Ambulatory Visit: Payer: 59 | Admitting: Family Medicine

## 2019-06-12 ENCOUNTER — Other Ambulatory Visit: Payer: Self-pay

## 2019-06-12 ENCOUNTER — Other Ambulatory Visit: Payer: 59

## 2019-06-12 ENCOUNTER — Ambulatory Visit (INDEPENDENT_AMBULATORY_CARE_PROVIDER_SITE_OTHER): Payer: 59 | Admitting: Psychology

## 2019-06-12 VITALS — BP 100/60 | HR 89 | Ht 65.0 in | Wt 131.0 lb

## 2019-06-12 DIAGNOSIS — F4323 Adjustment disorder with mixed anxiety and depressed mood: Secondary | ICD-10-CM

## 2019-06-12 DIAGNOSIS — R6889 Other general symptoms and signs: Secondary | ICD-10-CM

## 2019-06-12 DIAGNOSIS — M533 Sacrococcygeal disorders, not elsewhere classified: Secondary | ICD-10-CM | POA: Diagnosis not present

## 2019-06-12 DIAGNOSIS — M999 Biomechanical lesion, unspecified: Secondary | ICD-10-CM | POA: Diagnosis not present

## 2019-06-12 DIAGNOSIS — Z20822 Contact with and (suspected) exposure to covid-19: Secondary | ICD-10-CM

## 2019-06-12 NOTE — Addendum Note (Signed)
Addended by: Terence Lux B on: 06/12/2019 08:36 AM   Modules accepted: Orders

## 2019-06-12 NOTE — Patient Instructions (Addendum)
Prilosec daily for 2 weeks Ibuprofen 3 pills for 3 times a day for 3 days Follow up in 4-5 weeks

## 2019-06-13 LAB — SAR COV2 SEROLOGY (COVID19)AB(IGG),IA: SARS CoV2 AB IGG: NEGATIVE

## 2019-06-26 ENCOUNTER — Ambulatory Visit (INDEPENDENT_AMBULATORY_CARE_PROVIDER_SITE_OTHER): Payer: 59 | Admitting: Psychology

## 2019-06-26 DIAGNOSIS — F4323 Adjustment disorder with mixed anxiety and depressed mood: Secondary | ICD-10-CM

## 2019-07-09 NOTE — Progress Notes (Signed)
[name] [place], [place] [address] Phone: [phone] Subjective:   I Allison [name] am serving as a Education administrator for Dr. Hulan [name].  I'm seeing this patient by the request  of:    CC:  Back pain follow-up [id number]  Allison [name] is a 33 y.o. female coming in with complaint of back pain. States she is doing well. Piriformis on the left side has been tighter than usual.  Patient is in the process of starting her [facility] program.  Her doing more sitting with instability contributing somewhat.  Discussed icing regimen and home exercises, which activities of doing which wants to avoid.  Patient has been doing some movement but not as much.    Past Medical History:  Diagnosis Date  . Chicken pox   . Heart murmur   . History of lumbar compression fracture 06/27/2018   Compression fracture L2-L3 Dexa done 07/05/17 report in chart CT lumbar report from 04/22/17 in chart as well as xray from 04/19/17  . Lactose intolerance 07/11/2018   Age 28  . Osteoporosis, followed by Dr. Buddy Duty, thought to be due to Accutane 07/27/2017   Done density done 07/05/18 report in chart.   . Psoriasis 07/11/2018  . 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 KNEE ARTHROSCOPY Right   . WISDOM TOOTH EXTRACTION  2005   Social History   Socioeconomic History  . Marital status: Married    Spouse name: Not on file  . Number of children: 0  . Years of education: Not on file  . Highest education level: Not on file  Occupational History  . Occupation: Human resources officer: [employer]  . Financial resource strain: Not on file  . Food insecurity    Worry: Not on file    Inability: Not on file  . Transportation needs    Medical: Not on file    Non-medical: Not on file   Tobacco Use  . Smoking status: Never Smoker  . Smokeless tobacco: Never Used  Substance and Sexual Activity  . Alcohol use: Yes    Alcohol/week: 2.0 standard drinks    Types: 2 Glasses of wine per week    Comment: twice a month  . Drug use: Never  . Sexual activity: Yes    Partners: Male    Birth control/protection: Rhythm, Condom  Lifestyle  . Physical activity    Days per week: Not on file    Minutes per session: Not on file  . Stress: Not on file  Relationships  . Social Herbalist on phone: Not on file    Gets together: Not on file    Attends religious service: Not on file    Active member of club or organization: Not on file    Attends meetings of clubs or organizations: Not on file    Relationship status: Not on file  Other Topics Concern  . Not on file  Social History Narrative  . Not on file   Allergies  Allergen Reactions  . Morphine Rash    Age 28 said felt like skin burning tolerated hydrocodone in past    Family History  Problem Relation Age of Onset  . Depression Mother   . Miscarriages / [place] Mother   . High Cholesterol Father   .  Early death Maternal Grandmother        Brain aneurysm   . Cancer Maternal Grandfather   . COPD Paternal Grandmother   . Heart attack Paternal Grandmother          Current Outpatient Medications (Other):  Marland Kitchen  Calcium Carb-Cholecalciferol (CALCIUM-VITAMIN D) 500-200 MG-UNIT tablet, Take by mouth. .  cholecalciferol (VITAMIN D) 1000 units tablet, Take 2,000 Units by mouth daily. .  cyclobenzaprine (FLEXERIL) 5 MG tablet, Take 1 tablet (5 mg total) by mouth 3 (three) times daily as needed for muscle spasms.    Past medical history, social, surgical and family history all reviewed in electronic medical record.  No pertanent information unless stated regarding to the chief complaint.   Review of Systems:  No headache, visual changes, nausea, vomiting, diarrhea, constipation, dizziness, abdominal pain,  skin rash, fevers, chills, night sweats, weight loss, swollen lymph nodes, body aches, joint swelling, muscle aches, chest pain, shortness of breath, mood changes.   Objective  There were no vitals taken for this visit. Systems examined below as of    General: No apparent distress alert and oriented x3 mood and affect normal, dressed appropriately.  HEENT: Pupils equal, extraocular movements intact  Respiratory: Patient's speak in full sentences and does not appear short of breath  Cardiovascular: No lower extremity edema, non tender, no erythema  Skin: Warm dry intact with no signs of infection or rash on extremities or on axial skeleton.  Abdomen: Soft nontender  Neuro: Cranial nerves II through XII are intact, neurovascularly intact in all extremities with 2+ DTRs and 2+ pulses.  Lymph: No lymphadenopathy of posterior or anterior cervical chain or axillae bilaterally.  Gait normal with good balance and coordination.  MSK:  Non tender with full range of motion and good stability and symmetric strength and tone of shoulders, elbows, wrist, hip, knee and ankles bilaterally.  Neck: Inspection unremarkable. No palpable stepoffs. Negative Spurling's maneuver. Full neck range of motion Grip strength and sensation normal in bilateral hands Strength good C4 to T1 distribution No sensory change to C4 to T1 Negative Hoffman sign bilaterally Reflexes normal  Back Exam:  Inspection: Unremarkable  Motion: Flexion 45 deg, Extension 45 deg, Side Bending to 45 deg bilaterally,  Rotation to 45 deg bilaterally  SLR laying: Negative  XSLR laying: Negative  Palpable tenderness: Mild increase in tenderness mainly paraspinal musculature left side L5-S1. FABER: Positive left. Sensory change: Gross sensation intact to all lumbar and sacral dermatomes.  Reflexes: 2+ at both patellar tendons, 2+ at achilles tendons, Babinski's downgoing.  Strength at foot  Plantar-flexion: 5/5 Dorsi-flexion: 5/5  Eversion: 5/5 Inversion: 5/5  Leg strength  Quad: 5/5 Hamstring: 5/5 Hip flexor: 5/5 Hip abductors: 5/5  Gait unremarkable.  Osteopathic findings  C6 flexed rotated and side bent left T3 extended rotated and side bent right inhaled third rib T5 extended rotated and side bent left L2 flexed rotated and side bent right Sacrum right on right    Impression and Recommendations:     This case required medical decision making of moderate complexity. The above documentation has been reviewed and is accurate and complete Lyndal Pulley, DO       Note: This dictation was prepared with Dragon dictation along with smaller phrase technology. Any transcriptional errors that result from this process are unintentional.

## 2019-07-10 ENCOUNTER — Ambulatory Visit: Payer: 59 | Admitting: Family Medicine

## 2019-07-10 ENCOUNTER — Encounter: Payer: Self-pay | Admitting: Family Medicine

## 2019-07-10 ENCOUNTER — Other Ambulatory Visit: Payer: Self-pay

## 2019-07-10 VITALS — BP 100/60 | HR 78 | Ht 65.0 in | Wt 132.0 lb

## 2019-07-10 DIAGNOSIS — M533 Sacrococcygeal disorders, not elsewhere classified: Secondary | ICD-10-CM

## 2019-07-10 DIAGNOSIS — M999 Biomechanical lesion, unspecified: Secondary | ICD-10-CM

## 2019-07-10 NOTE — Assessment & Plan Note (Signed)
Patient does have sacroiliac joint dysfunction continues to give her some difficulty on the left side with a piriformis syndrome.  We discussed which activities of doing which wants to avoid.  Discussed posture and ergonomics.  Discussed avoiding certain activities.  Patient will follow-up with me again in 4 to 8 weeks.

## 2019-07-10 NOTE — Assessment & Plan Note (Signed)
Decision today to treat with OMT was based on Physical Exam  After verbal consent patient was treated with HVLA, ME, FPR techniques in cervical, thoracic, rib lumbar and sacral areas  Patient tolerated the procedure well with improvement in symptoms  Patient given exercises, stretches and lifestyle modifications  See medications in patient instructions if given  Patient will follow up in 4-8 weeks 

## 2019-07-10 NOTE — Patient Instructions (Signed)
Coop pillow See me again in 6-8 weeks

## 2019-07-12 ENCOUNTER — Ambulatory Visit (INDEPENDENT_AMBULATORY_CARE_PROVIDER_SITE_OTHER): Payer: 59 | Admitting: Psychology

## 2019-07-12 DIAGNOSIS — F4323 Adjustment disorder with mixed anxiety and depressed mood: Secondary | ICD-10-CM | POA: Diagnosis not present

## 2019-08-13 NOTE — Progress Notes (Deleted)
Subjective:    Allison [name] is a 33 y.o. female and is here for a comprehensive physical exam.   Current Outpatient Medications:  .  Calcium Carb-Cholecalciferol (CALCIUM-VITAMIN D) 500-200 MG-UNIT tablet, Take by mouth., Disp: , Rfl:  .  cholecalciferol (VITAMIN D) 1000 units tablet, Take 2,000 Units by mouth daily., Disp: , Rfl:  .  cyclobenzaprine (FLEXERIL) 5 MG tablet, Take 1 tablet (5 mg total) by mouth 3 (three) times daily as needed for muscle spasms., Disp: 30 tablet, Rfl: 0  Health Maintenance Due  Topic Date Due  . INFLUENZA VACCINE  06/17/2019    PMHx, SurgHx, SocialHx, Medications, and Allergies were reviewed in the Visit Navigator and updated as appropriate.   Past Medical History:  Diagnosis Date  . Chicken pox   . Heart murmur   . History of lumbar compression fracture 06/27/2018   Compression fracture L2-L3 Dexa done 07/05/17 report in chart CT lumbar report from 04/22/17 in chart as well as xray from 04/19/17  . Lactose intolerance 07/11/2018   Age 50  . Osteoporosis, followed by Dr. Buddy Duty, thought to be due to Accutane 07/27/2017   Done density done 07/05/18 report in chart.   . Psoriasis 07/11/2018  . 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 KNEE ARTHROSCOPY Right   . WISDOM TOOTH EXTRACTION  2005    Family History  Problem Relation Age of Onset  . Depression Mother   . Miscarriages / [place] Mother   . High Cholesterol Father   . Early death Maternal Grandmother        Brain aneurysm   . Cancer Maternal Grandfather   . COPD Paternal Grandmother   . Heart attack Paternal Grandmother     Social History   Tobacco Use  . Smoking status: Never Smoker  . Smokeless tobacco: Never Used  Substance Use Topics  . Alcohol use: Yes    Alcohol/week: 2.0 standard drinks    Types: 2 Glasses  of wine per week    Comment: twice a month  . Drug use: Never    Review of Systems:   Pertinent items are noted in the HPI. Otherwise, ROS is negative.  Objective:   There were no vitals taken for this visit.   General appearance: alert, cooperative and appears stated age. Head: normocephalic, without obvious abnormality, atraumatic. Neck: no adenopathy, supple, symmetrical, trachea midline; thyroid not enlarged, symmetric, no tenderness/mass/nodules. Lungs: clear to auscultation bilaterally. Breasts: inspection negative, no nipple retraction or dimpling, no nipple discharge or bleeding, no axillary or supraclavicular adenopathy, normal to palpation without dominant masses. Heart: regular rate and rhythm Abdomen: soft, non-tender; no masses,  no organomegaly. Extremities: extremities normal, atraumatic, no cyanosis or edema. Skin: skin color, texture, turgor normal, no rashes or lesions. Lymph: cervical, supraclavicular, and axillary nodes normal; no abnormal inguinal nodes palpated. Neurologic: grossly normal.  Pelvic:  External genitalia: no lesions. Urethra: normal appearing urethra with no masses, tenderness or lesions. Bartholins and Skenes: normal. Vagina: normal appearing vagina with normal color and discharge, no lesions. Cervix: normal appearance. Pap and high risk HPV testing done: {yes no:[id number]} Bimanual Exam:   Uterus: uterus is normal size, shape, consistency and nontender. Adnexa: normal adnexa in size, nontender and no masses.  Assessment/Plan:   There are no diagnoses linked to this encounter.  Patient Counseling:   [x]   Nutrition: Stressed importance of moderation in sodium/caffeine intake, saturated fat and cholesterol, caloric balance, sufficient intake of fresh fruits, vegetables, fiber, calcium, iron, and 1 mg of folate supplement per day (for females capable of pregnancy).   [x]      Stressed the importance of regular exercise.    [x]     Substance  Abuse: Discussed cessation/primary prevention of tobacco, alcohol, or other drug use; driving or other dangerous activities under the influence; availability of treatment for abuse.    [x]      Injury prevention: Discussed safety belts, safety helmets, smoke detector, smoking near bedding or upholstery.    [x]      Sexuality: Discussed sexually transmitted diseases, partner selection, use of condoms, avoidance of unintended pregnancy  and contraceptive alternatives.    [x]     Dental health: Discussed importance of regular tooth brushing, flossing, and dental visits.   [x]      Health maintenance and immunizations reviewed. Please refer to Health maintenance section.   Briscoe Deutscher, DO [place]

## 2019-08-14 ENCOUNTER — Ambulatory Visit (INDEPENDENT_AMBULATORY_CARE_PROVIDER_SITE_OTHER): Payer: 59 | Admitting: Psychology

## 2019-08-14 ENCOUNTER — Encounter: Payer: 59 | Admitting: Family Medicine

## 2019-08-14 DIAGNOSIS — F4323 Adjustment disorder with mixed anxiety and depressed mood: Secondary | ICD-10-CM | POA: Diagnosis not present

## 2019-08-20 NOTE — Progress Notes (Signed)
[name] [address] [place], [place] [address] Phone: [phone] Subjective:    I'm seeing this patient by the request  of:    CC: Low back pain follow-up   [id number]    I, Wendy Poet, LAT, ATC, am serving as scribe for Dr. Hulan Saas.  07/10/19: Patient does have sacroiliac joint dysfunction continues to give her some difficulty on the left side with a piriformis syndrome.  We discussed which activities of doing which wants to avoid.  Discussed posture and ergonomics.  Discussed avoiding certain activities.  Patient will follow-up with me again in 4 to 8 weeks.   Update- 08/21/19 Allison [name] is a 33 y.o. female coming in with complaint of low back pain / SIJ dysfunction.  Pt states that she feels like she's regressed a bit since her last visit on 07/10/19 in her upper back and neck.  Pt states that she has gotten the coop pillow and has been using that for about a week.      Past Medical History:  Diagnosis Date  . Chicken pox   . Heart murmur   . History of lumbar compression fracture 06/27/2018   Compression fracture L2-L3 Dexa done 07/05/17 report in chart CT lumbar report from 04/22/17 in chart as well as xray from 04/19/17  . Lactose intolerance 07/11/2018   Age 69  . Osteoporosis, followed by Dr. Buddy Duty, thought to be due to Accutane 07/27/2017   Done density done 07/05/18 report in chart.   . Psoriasis 07/11/2018  . 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 KNEE ARTHROSCOPY Right   . WISDOM TOOTH EXTRACTION  2005   Social History   Socioeconomic History  . Marital status: Married    Spouse name: Not on file  . Number of children: 0  . Years of education: Not on file  . Highest education level: Not on file  Occupational History  . Occupation: Manufacturing engineer: [employer]  . Financial resource strain: Not on file  . Food insecurity    Worry: Not on file    Inability: Not on file  . Transportation needs    Medical: Not on file    Non-medical: Not on file  Tobacco Use  . Smoking status: Never Smoker  . Smokeless tobacco: Never Used  Substance and Sexual Activity  . Alcohol use: Yes    Alcohol/week: 2.0 standard drinks    Types: 2 Glasses of wine per week    Comment: twice a month  . Drug use: Never  . Sexual activity: Yes    Partners: Male    Birth control/protection: Rhythm, Condom  Lifestyle  . Physical activity    Days per week: Not on file    Minutes per session: Not on file  . Stress: Not on file  Relationships  . Social Herbalist on phone: Not on file    Gets together: Not on file    Attends religious service: Not on file    Active member of club or organization: Not on file    Attends meetings of clubs or organizations: Not on file    Relationship status: Not on file  Other Topics Concern  . Not on file  Social History Narrative  . Not on file   Allergies  Allergen Reactions  . Morphine Rash    Age 14 said felt like skin burning tolerated hydrocodone in past    Family History  Problem Relation Age of Onset  . Depression Mother   . Miscarriages / [place] Mother   . High Cholesterol Father   . Early death Maternal Grandmother        Brain aneurysm   . Cancer Maternal Grandfather   . COPD Paternal Grandmother   . Heart attack Paternal Grandmother          Current Outpatient Medications (Other):  Marland Kitchen  Calcium Carb-Cholecalciferol (CALCIUM-VITAMIN D) 500-200 MG-UNIT tablet, Take by mouth. .  cholecalciferol (VITAMIN D) 1000 units tablet, Take 2,000 Units by mouth daily. .  cyclobenzaprine (FLEXERIL) 5 MG tablet, Take 1 tablet (5 mg total) by mouth 3 (three) times daily as needed for muscle spasms. (Patient not taking: Reported on 08/21/2019)    Past  medical history, social, surgical and family history all reviewed in electronic medical record.  No pertanent information unless stated regarding to the chief complaint.   Review of Systems:  No headache, visual changes, nausea, vomiting, diarrhea, constipation, dizziness, abdominal pain, skin rash, fevers, chills, night sweats, weight loss, swollen lymph nodes, body aches, joint swelling,  chest pain, shortness of breath, mood changes.  Positive muscle aches  Objective  Blood pressure 104/72, pulse 85, height 5\' 5"  (1.651 m), weight 132 lb (59.9 kg), last menstrual period 08/18/2019, SpO2 98 %.    General: No apparent distress alert and oriented x3 mood and affect normal, dressed appropriately.  HEENT: Pupils equal, extraocular movements intact  Respiratory: Patient's speak in full sentences and does not appear short of breath  Cardiovascular: No lower extremity edema, non tender, no erythema  Skin: Warm dry intact with no signs of infection or rash on extremities or on axial skeleton.  Abdomen: Soft nontender  Neuro: Cranial nerves II through XII are intact, neurovascularly intact in all extremities with 2+ DTRs and 2+ pulses.  Lymph: No lymphadenopathy of posterior or anterior cervical chain or axillae bilaterally.  Gait normal with good balance and coordination.  MSK:  tender with full range of motion and good stability and symmetric strength and tone of shoulders, elbows, wrist, hip, knee and ankles bilaterally.  Low back nontender to palpation lumbosacral area.  Tightness with Corky Sox test.  Diffusely nontender than previous exam.More pain in the thoracolumbar juncture as well  Osteopathic findings  T3 extended rotated and side bent right inhaled third rib T5 extended rotated and side bent left L2 flexed rotated and side bent right Sacrum right on right    Impression and Recommendations:     This case required medical decision making of moderate complexity. The above  documentation has been reviewed and is accurate and complete Lyndal Pulley, DO       Note: This dictation was prepared with Dragon dictation along with smaller phrase technology. Any transcriptional errors that result from this process are unintentional.

## 2019-08-21 ENCOUNTER — Ambulatory Visit: Payer: 59 | Admitting: Family Medicine

## 2019-08-21 ENCOUNTER — Encounter: Payer: Self-pay | Admitting: Family Medicine

## 2019-08-21 ENCOUNTER — Other Ambulatory Visit: Payer: Self-pay

## 2019-08-21 VITALS — BP 104/72 | HR 85 | Ht 65.0 in | Wt 132.0 lb

## 2019-08-21 DIAGNOSIS — M533 Sacrococcygeal disorders, not elsewhere classified: Secondary | ICD-10-CM

## 2019-08-21 DIAGNOSIS — M2550 Pain in unspecified joint: Secondary | ICD-10-CM

## 2019-08-21 DIAGNOSIS — M999 Biomechanical lesion, unspecified: Secondary | ICD-10-CM | POA: Diagnosis not present

## 2019-08-21 NOTE — Patient Instructions (Signed)
Ferritin, iron and vit D labs added for your physical.  See me again in 5 weeks.  Con't w/ all the other things you've been doing.

## 2019-08-21 NOTE — Assessment & Plan Note (Signed)
Decision today to treat with OMT was based on Physical Exam  After verbal consent patient was treated with HVLA, ME, FPR techniques in  thoracic, rib lumbar and sacral areas  Patient tolerated the procedure well with improvement in symptoms  Patient given exercises, stretches and lifestyle modifications  See medications in patient instructions if given  Patient will follow up in 4-6 weeks 

## 2019-08-21 NOTE — Assessment & Plan Note (Signed)
Stable mild exacerbation from previous exams.  Discussed posture and ergonomics, discussed which activities to do which was to avoid.  Follow-up again 4 to 8 weeks

## 2019-08-25 ENCOUNTER — Encounter: Payer: Self-pay | Admitting: Physician Assistant

## 2019-08-25 ENCOUNTER — Other Ambulatory Visit: Payer: Self-pay

## 2019-08-25 ENCOUNTER — Ambulatory Visit (INDEPENDENT_AMBULATORY_CARE_PROVIDER_SITE_OTHER): Payer: 59 | Admitting: Physician Assistant

## 2019-08-25 VITALS — BP 106/78 | HR 76 | Temp 97.8°F | Ht 66.5 in | Wt 131.0 lb

## 2019-08-25 DIAGNOSIS — M810 Age-related osteoporosis without current pathological fracture: Secondary | ICD-10-CM

## 2019-08-25 DIAGNOSIS — Z13220 Encounter for screening for lipoid disorders: Secondary | ICD-10-CM

## 2019-08-25 DIAGNOSIS — Z0000 Encounter for general adult medical examination without abnormal findings: Secondary | ICD-10-CM

## 2019-08-25 DIAGNOSIS — D649 Anemia, unspecified: Secondary | ICD-10-CM

## 2019-08-25 LAB — COMPREHENSIVE METABOLIC PANEL
ALT: 11 U/L (ref 0–35)
AST: 14 U/L (ref 0–37)
Albumin: 4.4 g/dL (ref 3.5–5.2)
Alkaline Phosphatase: 61 U/L (ref 39–117)
BUN: 10 mg/dL (ref 6–23)
CO2: 26 mEq/L (ref 19–32)
Calcium: 9 mg/dL (ref 8.4–10.5)
Chloride: 105 mEq/L (ref 96–112)
Creatinine, Ser: 0.69 mg/dL (ref 0.40–1.20)
GFR: 98.46 mL/min (ref >60.00)
Glucose, Bld: 91 mg/dL (ref 70–99)
Potassium: 4 mEq/L (ref 3.5–5.1)
Sodium: 139 mEq/L (ref 135–145)
Total Bilirubin: 0.6 mg/dL (ref 0.2–1.2)
Total Protein: 6.7 g/dL (ref 6.0–8.3)

## 2019-08-25 LAB — CBC WITH DIFFERENTIAL/PLATELET
Basophils Absolute: 0 10*3/uL (ref 0.0–0.1)
Basophils Relative: 1.2 % (ref 0.0–3.0)
Eosinophils Absolute: 0.1 10*3/uL (ref 0.0–0.7)
Eosinophils Relative: 2.4 % (ref 0.0–5.0)
HCT: 42.1 % (ref 36.0–46.0)
Hemoglobin: 13.9 g/dL (ref 12.0–15.0)
Lymphocytes Relative: 27.5 % (ref 12.0–46.0)
Lymphs Abs: 1.1 10*3/uL (ref 0.7–4.0)
MCHC: 33 g/dL (ref 30.0–36.0)
MCV: 89.5 fl (ref 78.0–100.0)
Monocytes Absolute: 0.3 10*3/uL (ref 0.1–1.0)
Monocytes Relative: 7.8 % (ref 3.0–12.0)
Neutro Abs: 2.4 10*3/uL (ref 1.4–7.7)
Neutrophils Relative %: 61.1 % (ref 43.0–77.0)
Platelets: 238 10*3/uL (ref 150.0–400.0)
RBC: 4.71 Mil/uL (ref 3.87–5.11)
RDW: 13.3 % (ref 11.5–15.5)
WBC: 3.9 10*3/uL — ABNORMAL LOW (ref 4.0–10.5)

## 2019-08-25 LAB — LIPID PANEL
Cholesterol: 152 mg/dL (ref 0–200)
HDL: 63.7 mg/dL (ref >39.00)
LDL Cholesterol: 79 mg/dL (ref 0–99)
NonHDL: 88.04
Total CHOL/HDL Ratio: 2
Triglycerides: 45 mg/dL (ref 0.0–149.0)
VLDL: 9 mg/dL (ref 0.0–40.0)

## 2019-08-25 LAB — SEDIMENTATION RATE: Sed Rate: 1 mm/hr (ref 0–20)

## 2019-08-25 LAB — VITAMIN D 25 HYDROXY (VIT D DEFICIENCY, FRACTURES): VITD: 35.75 ng/mL (ref 30.00–100.00)

## 2019-08-25 NOTE — Patient Instructions (Signed)
It was great to see you!  Please go to the lab for blood work.   Our office will call you with your results unless you have chosen to receive results via MyChart.  If your blood work is normal we will follow-up each year for physicals and as scheduled for chronic medical problems.  If anything is abnormal we will treat accordingly and get you in for a follow-up.  Take care,  Liz Pinho    Health Maintenance, Female Adopting a healthy lifestyle and getting preventive care are important in promoting health and wellness. Ask your health care provider about:  The right schedule for you to have regular tests and exams.  Things you can do on your own to prevent diseases and keep yourself healthy. What should I know about diet, weight, and exercise? Eat a healthy diet   Eat a diet that includes plenty of vegetables, fruits, low-fat dairy products, and lean protein.  Do not eat a lot of foods that are high in solid fats, added sugars, or sodium. Maintain a healthy weight Body mass index (BMI) is used to identify weight problems. It estimates body fat based on height and weight. Your health care provider can help determine your BMI and help you achieve or maintain a healthy weight. Get regular exercise Get regular exercise. This is one of the most important things you can do for your health. Most adults should:  Exercise for at least 150 minutes each week. The exercise should increase your heart rate and make you sweat (moderate-intensity exercise).  Do strengthening exercises at least twice a week. This is in addition to the moderate-intensity exercise.  Spend less time sitting. Even light physical activity can be beneficial. Watch cholesterol and blood lipids Have your blood tested for lipids and cholesterol at 32 years of age, then have this test every 5 years. Have your cholesterol levels checked more often if:  Your lipid or cholesterol levels are high.  You are older than 32  years of age.  You are at high risk for heart disease. What should I know about cancer screening? Depending on your health history and family history, you may need to have cancer screening at various ages. This may include screening for:  Breast cancer.  Cervical cancer.  Colorectal cancer.  Skin cancer.  Lung cancer. What should I know about heart disease, diabetes, and high blood pressure? Blood pressure and heart disease  High blood pressure causes heart disease and increases the risk of stroke. This is more likely to develop in people who have high blood pressure readings, are of African descent, or are overweight.  Have your blood pressure checked: ? Every 3-5 years if you are 18-39 years of age. ? Every year if you are 40 years old or older. Diabetes Have regular diabetes screenings. This checks your fasting blood sugar level. Have the screening done:  Once every three years after age 40 if you are at a normal weight and have a low risk for diabetes.  More often and at a younger age if you are overweight or have a high risk for diabetes. What should I know about preventing infection? Hepatitis B If you have a higher risk for hepatitis B, you should be screened for this virus. Talk with your health care provider to find out if you are at risk for hepatitis B infection. Hepatitis C Testing is recommended for:  Everyone born from 1945 through 1965.  Anyone with known risk factors for hepatitis C. Sexually   transmitted infections (STIs)  Get screened for STIs, including gonorrhea and chlamydia, if: ? You are sexually active and are younger than 32 years of age. ? You are older than 32 years of age and your health care provider tells you that you are at risk for this type of infection. ? Your sexual activity has changed since you were last screened, and you are at increased risk for chlamydia or gonorrhea. Ask your health care provider if you are at risk.  Ask your health  care provider about whether you are at high risk for HIV. Your health care provider may recommend a prescription medicine to help prevent HIV infection. If you choose to take medicine to prevent HIV, you should first get tested for HIV. You should then be tested every 3 months for as long as you are taking the medicine. Pregnancy  If you are about to stop having your period (premenopausal) and you may become pregnant, seek counseling before you get pregnant.  Take 400 to 800 micrograms (mcg) of folic acid every day if you become pregnant.  Ask for birth control (contraception) if you want to prevent pregnancy. Osteoporosis and menopause Osteoporosis is a disease in which the bones lose minerals and strength with aging. This can result in bone fractures. If you are 65 years old or older, or if you are at risk for osteoporosis and fractures, ask your health care provider if you should:  Be screened for bone loss.  Take a calcium or vitamin D supplement to lower your risk of fractures.  Be given hormone replacement therapy (HRT) to treat symptoms of menopause. Follow these instructions at home: Lifestyle  Do not use any products that contain nicotine or tobacco, such as cigarettes, e-cigarettes, and chewing tobacco. If you need help quitting, ask your health care provider.  Do not use street drugs.  Do not share needles.  Ask your health care provider for help if you need support or information about quitting drugs. Alcohol use  Do not drink alcohol if: ? Your health care provider tells you not to drink. ? You are pregnant, may be pregnant, or are planning to become pregnant.  If you drink alcohol: ? Limit how much you use to 0-1 drink a day. ? Limit intake if you are breastfeeding.  Be aware of how much alcohol is in your drink. In the [place] one drink equals one 12 oz bottle of beer (355 mL), one 5 oz glass of wine (148 mL), or one 1 oz glass of hard liquor (44 mL). General  instructions  Schedule regular health, dental, and eye exams.  Stay current with your vaccines.  Tell your health care provider if: ? You often feel depressed. ? You have ever been abused or do not feel safe at home. Summary  Adopting a healthy lifestyle and getting preventive care are important in promoting health and wellness.  Follow your health care provider's instructions about healthy diet, exercising, and getting tested or screened for diseases.  Follow your health care provider's instructions on monitoring your cholesterol and blood pressure. This information is not intended to replace advice given to you by your health care provider. Make sure you discuss any questions you have with your health care provider. Document Released: 05/18/2011 Document Revised: 10/26/2018 Document Reviewed: 10/26/2018 Elsevier Patient Education  2020 Elsevier Inc.  

## 2019-08-25 NOTE — Progress Notes (Signed)
I acted as a Education administrator for [employer], PA-C Anselmo Pickler, LPN  Subjective:    Allison [name] is a 33 y.o. female and is here for a comprehensive physical exam.  HPI  There are no preventive care reminders to display for this patient.  Acute Concerns: None  Chronic Issues: Osteoporosis -- awaiting DEXA next month. Currently seeing Dr. Delane Ginger with [facility]. Currently not on any medications per most recent endo visit recommendation. Sees PT regularly, as well as Dr. Charlann Boxer. Anemia -- currently taking oral iron. Tolerates well. Takes most days. Has heavy period day 2. Denies: dizziness, lightheadedness, rectal bleeding, decreased energy.  Health Maintenance: Immunizations -- UTD Colonoscopy -- N/A Mammogram -- N/A PAP -- UTD Bone Density -- due, last done 06/2017 Diet -- well balanced Sleep habits -- overall ok, sometimes has difficulty getting comfortable Exercise -- walks daily, sees PT monthly Current Weight -- Weight: 131 lb (59.4 kg)  Weight History: Wt Readings from Last 10 Encounters:  08/25/19 131 lb (59.4 kg)  08/21/19 132 lb (59.9 kg)  07/10/19 132 lb (59.9 kg)  06/12/19 131 lb (59.4 kg)  04/17/19 135 lb (61.2 kg)  03/06/19 135 lb (61.2 kg)  01/23/19 136 lb (61.7 kg)  11/28/18 132 lb (59.9 kg)  11/16/18 135 lb (61.2 kg)  10/17/18 130 lb (59 kg)   Mood -- has anxiety and depression related to her osteoporosis diagnosis Patient's last menstrual period was 08/18/2019. Period characteristics -- heavy day 2 Birth control -- none -- uses planning and pull out method  Depression screen [facility] 2/9 08/25/2019  Decreased Interest 0  Down, Depressed, Hopeless 0  PHQ - 2 Score 0  Altered sleeping -  Tired, decreased energy -  Change in appetite -  Feeling bad or failure about yourself  -  Trouble concentrating -  Moving slowly or fidgety/restless -  Suicidal thoughts -  PHQ-9 Score -     Other providers/specialists: Patient Care Team: Inda Coke, [place] as PCP - General (Physician Assistant) Lesle Reek, NP as Nurse Practitioner (Neurosurgery) Lyndal Pulley, DO as Consulting Physician (Family Medicine) Lona Millard, MD as Referring Physician (Endocrinology) Shelor Melburn Hake, Rex Kras, LCSW as Social Worker (Licensed Clinical Social Worker) Talbert Nan, Francesca Jewett, MD as Consulting Physician (Obstetrics and Gynecology)   PMHx, SurgHx, SocialHx, Medications, and Allergies were reviewed in the Visit Navigator and updated as appropriate.   Past Medical History:  Diagnosis Date  . Chicken pox   . Heart murmur   . History of lumbar compression fracture 06/27/2018   Compression fracture L2-L3 Dexa done 07/05/17 report in chart CT lumbar report from 04/22/17 in chart as well as xray from 04/19/17  . Lactose intolerance 07/11/2018   Age 61  . Osteoporosis, followed by Dr. Buddy Duty, thought to be due to Accutane 07/27/2017   Done density done 07/05/18 report in chart.   . Psoriasis 07/11/2018  . 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 KNEE ARTHROSCOPY Right   . WISDOM TOOTH EXTRACTION  2005     Family History  Problem Relation Age of Onset  . Depression Mother   . Miscarriages / [place] Mother   . High Cholesterol Father   . Early death Maternal Grandmother        Brain aneurysm   . Cancer Maternal Grandfather   .  COPD Paternal Grandmother   . Heart attack Paternal Grandmother     Social History   Tobacco Use  . Smoking status: Never Smoker  . Smokeless tobacco: Never Used  Substance Use Topics  . Alcohol use: Yes    Alcohol/week: 2.0 standard drinks    Types: 2 Glasses of wine per week    Comment: twice a month  . Drug use: Never    Review of Systems:   Review of Systems  Constitutional: Negative for chills, fever, malaise/fatigue and  weight loss.  HENT: Negative for hearing loss, sinus pain and sore throat.   Eyes: Negative for blurred vision.  Respiratory: Negative for cough and shortness of breath.   Cardiovascular: Negative for chest pain, palpitations and leg swelling.  Gastrointestinal: Negative for abdominal pain, constipation, diarrhea, heartburn, nausea and vomiting.  Genitourinary: Negative for dysuria, frequency and urgency.  Musculoskeletal: Negative for back pain, myalgias and neck pain.  Skin: Negative for itching and rash.  Neurological: Negative for dizziness, tingling, seizures, loss of consciousness and headaches.  Endo/Heme/Allergies: Negative for polydipsia.  Psychiatric/Behavioral: Negative for depression. The patient is not nervous/anxious.   All other systems reviewed and are negative.   Objective:   BP 106/78 (BP Location: Left Arm, Patient Position: Sitting, Cuff Size: Normal)   Pulse 76   Temp 97.8 F (36.6 C) (Temporal)   Ht 5' 6.5" (1.689 m)   Wt 131 lb (59.4 kg)   LMP 08/18/2019   SpO2 96%   BMI 20.83 kg/m   General Appearance:    Alert, cooperative, no distress, appears stated age  Head:    Normocephalic, without obvious abnormality, atraumatic  Eyes:    PERRL, conjunctiva/corneas clear, EOM's intact, fundi    benign, both eyes  Ears:    Normal TM's and external ear canals, both ears  Nose:   Nares normal, septum midline, mucosa normal, no drainage    or sinus tenderness  Throat:   Lips, mucosa, and tongue normal; teeth and gums normal  Neck:   Supple, symmetrical, trachea midline, no adenopathy;    thyroid:  no enlargement/tenderness/nodules; no carotid   bruit or JVD  Back:     Symmetric, no curvature, ROM normal, no CVA tenderness  Lungs:     Clear to auscultation bilaterally, respirations unlabored  Chest Wall:    No tenderness or deformity   Heart:    Regular rate and rhythm, S1 and S2 normal, no murmur, rub   or gallop  Breast Exam:    Deferred  Abdomen:     Soft,  non-tender, bowel sounds active all four quadrants,    no masses, no organomegaly  Genitalia:    Deferred  Rectal:    Deferred  Extremities:   Extremities normal, atraumatic, no cyanosis or edema  Pulses:   2+ and symmetric all extremities  Skin:   Skin color, texture, turgor normal, no rashes or lesions  Lymph nodes:   Cervical, supraclavicular, and axillary nodes normal  Neurologic:   CNII-XII intact, normal strength, sensation and reflexes    Throughout     Assessment/Plan:   Allison [name] was seen today for annual exam.  Diagnoses and all orders for this visit:  Routine physical examination Today patient counseled on age appropriate routine health concerns for screening and prevention, each reviewed and up to date or declined. Immunizations reviewed and up to date or declined. Labs ordered and reviewed. Risk factors for depression reviewed and negative. Hearing function and visual acuity are intact. ADLs screened  and addressed as needed. Functional ability and level of safety reviewed and appropriate. Education, counseling and referrals performed based on assessed risks today. Patient provided with a copy of personalized plan for preventive services. -     CBC with Differential/Platelet -     Comprehensive metabolic panel  Screening for lipid disorders -     Lipid panel  Osteoporosis, unspecified osteoporosis type, unspecified pathological fracture presence Management per endo and Charlann Boxer. -     Sedimentation rate -     VITAMIN D 25 Hydroxy (Vit-D Deficiency, Fractures)  Anemia, unspecified type Update labs today. -     Iron, TIBC and Ferritin Panel    Well Adult Exam: Labs ordered: Yes. Patient counseling was done. See below for items discussed. Discussed the patient's BMI. The BMI is in the acceptable range Follow up in one year.  Patient Counseling:   [x]     Nutrition: Stressed importance of moderation in sodium/caffeine intake, saturated fat and cholesterol, caloric  balance, sufficient intake of fresh fruits, vegetables, fiber, calcium, iron, and 1 mg of folate supplement per day (for females capable of pregnancy).   [x]      Stressed the importance of regular exercise.    [x]     Substance Abuse: Discussed cessation/primary prevention of tobacco, alcohol, or other drug use; driving or other dangerous activities under the influence; availability of treatment for abuse.    [x]      Injury prevention: Discussed safety belts, safety helmets, smoke detector, smoking near bedding or upholstery.    [x]      Sexuality: Discussed sexually transmitted diseases, partner selection, use of condoms, avoidance of unintended pregnancy  and contraceptive alternatives.    [x]     Dental health: Discussed importance of regular tooth brushing, flossing, and dental visits.   [x]      Health maintenance and immunizations reviewed. Please refer to Health maintenance section.   CMA or LPN served as scribe during this visit. History, Physical, and Plan performed by medical provider. The above documentation has been reviewed and is accurate and complete.   Inda Coke, PA-C [place]

## 2019-08-26 LAB — IRON,TIBC AND FERRITIN PANEL
%SAT: 22 % (calc) (ref 16–45)
Ferritin: 27 ng/mL (ref 16–154)
Iron: 80 ug/dL (ref 40–190)
TIBC: 372 mcg/dL (calc) (ref 250–450)

## 2019-09-04 ENCOUNTER — Ambulatory Visit (INDEPENDENT_AMBULATORY_CARE_PROVIDER_SITE_OTHER): Payer: 59 | Admitting: Psychology

## 2019-09-04 DIAGNOSIS — F4323 Adjustment disorder with mixed anxiety and depressed mood: Secondary | ICD-10-CM | POA: Diagnosis not present

## 2019-09-17 NOTE — Progress Notes (Signed)
[name] [address] [place], [place] [address] Phone: [phone] Subjective:   I Allison [name] am serving as a Education administrator for Dr. Hulan Saas.  I'm seeing this patient by the request  of:    CC: Low back pain follow-up  [id number]     Update 09/18/2019 Allison [name] is a 33 y.o. female coming in with complaint of back pain. Patient states she is doing well. Right sided thoracic pain is still an issue.  Patient states that overall movement of the upper back seems to be giving her more discomfort.  Patient denies any numbness or tingling.  Patient denies any radiation to any of the extremities.  Just some discomfort noticed chronic but not debilitating.     Past Medical History:  Diagnosis Date  . Chicken pox   . Heart murmur   . History of lumbar compression fracture 06/27/2018   Compression fracture L2-L3 Dexa done 07/05/17 report in chart CT lumbar report from 04/22/17 in chart as well as xray from 04/19/17  . Lactose intolerance 07/11/2018   Age 19  . Osteoporosis, followed by Dr. Buddy Duty, thought to be due to Accutane 07/27/2017   Done density done 07/05/18 report in chart.   . Psoriasis 07/11/2018  . 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 KNEE ARTHROSCOPY Right   . WISDOM TOOTH EXTRACTION  2005   Social History   Socioeconomic History  . Marital status: Married    Spouse name: Not on file  . Number of children: 0  . Years of education: Not on file  . Highest education level: Not on file  Occupational History  . Occupation: Human resources officer: [employer]  . Financial resource strain: Not on file  . Food insecurity    Worry: Not on file    Inability: Not on file  . Transportation needs    Medical: Not on file    Non-medical: Not on  file  Tobacco Use  . Smoking status: Never Smoker  . Smokeless tobacco: Never Used  Substance and Sexual Activity  . Alcohol use: Yes    Alcohol/week: 2.0 standard drinks    Types: 2 Glasses of wine per week    Comment: twice a month  . Drug use: Never  . Sexual activity: Yes    Partners: Male    Birth control/protection: Rhythm, Condom  Lifestyle  . Physical activity    Days per week: Not on file    Minutes per session: Not on file  . Stress: Not on file  Relationships  . Social Herbalist on phone: Not on file    Gets together: Not on file    Attends religious service: Not on file    Active member of club or organization: Not on file    Attends meetings of clubs or organizations: Not on file    Relationship status: Not on file  Other Topics Concern  . 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 Morphine Rash    Age 19 said felt like skin burning tolerated hydrocodone in past    Family History  Problem Relation Age of Onset  .  Depression Mother   . Miscarriages / [place] Mother   . High Cholesterol Father   . Early death Maternal Grandmother        Brain aneurysm   . Cancer Maternal Grandfather   . COPD Paternal Grandmother   . Heart attack Paternal Grandmother         Current Outpatient Medications (Hematological):  Marland Kitchen  Polysaccharide Iron Complex (NU-IRON PO), Take 1 tablet by mouth daily.   Current Outpatient Medications (Other):  Marland Kitchen  Calcium Carb-Cholecalciferol (CALCIUM-VITAMIN D) 500-200 MG-UNIT tablet, Take by mouth. .  cholecalciferol (VITAMIN D) 1000 units tablet, Take 2,000 Units by mouth daily. .  cyclobenzaprine (FLEXERIL) 5 MG tablet, Take 1 tablet (5 mg total) by mouth 3 (three) times daily as needed for muscle spasms. .  vitamin C (ASCORBIC ACID) 500 MG tablet, Take 500 mg by mouth daily.    Past medical history, social,  surgical and family history all reviewed in electronic medical record.  No pertanent information unless stated regarding to the chief complaint.   Review of Systems:  No headache, visual changes, nausea, vomiting, diarrhea, constipation, dizziness, abdominal pain, skin rash, fevers, chills, night sweats, weight loss, swollen lymph nodes, body aches, joint swelling, muscle aches, chest pain, shortness of breath, mood changes.   Objective  There were no vitals taken for this visit. Systems examined below as of    General: No apparent distress alert and oriented x3 mood and affect normal, dressed appropriately.  HEENT: Pupils equal, extraocular movements intact  Respiratory: Patient's speak in full sentences and does not appear short of breath  Cardiovascular: No lower extremity edema, non tender, no erythema  Skin: Warm dry intact with no signs of infection or rash on extremities or on axial skeleton.  Abdomen: Soft nontender  Neuro: Cranial nerves II through XII are intact, neurovascularly intact in all extremities with 2+ DTRs and 2+ pulses.  Lymph: No lymphadenopathy of posterior or anterior cervical chain or axillae bilaterally.  Gait normal with good balance and coordination.  MSK:  Non tender with full range of motion and good stability and symmetric strength and tone of shoulders, elbows, wrist, hip, knee and ankles bilaterally.  Back exam shows some mild loss of lordosis in the lumbar spine.  Tightness noted in the sacroiliac joint bilaterally but no significant swelling.  Tenderness over the piriformis bilaterally.  Negative straight leg test.  Does left last 10 degrees of extension.  Osteopathic findings  C6 flexed rotated and side bent left T3 extended rotated and side bent right inhaled third rib T7 extended rotated and side bent left L2 flexed rotated and side bent right Sacrum right on right Pelvic shear right   Impression and Recommendations:     This case required  medical decision making of moderate complexity. The above documentation has been reviewed and is accurate and complete Lyndal Pulley, DO       Note: This dictation was prepared with Dragon dictation along with smaller phrase technology. Any transcriptional errors that result from this process are unintentional.

## 2019-09-18 ENCOUNTER — Other Ambulatory Visit: Payer: Self-pay

## 2019-09-18 ENCOUNTER — Ambulatory Visit: Payer: 59 | Admitting: Family Medicine

## 2019-09-18 ENCOUNTER — Encounter: Payer: Self-pay | Admitting: Family Medicine

## 2019-09-18 VITALS — BP 110/72 | HR 83 | Ht 66.5 in | Wt 134.0 lb

## 2019-09-18 DIAGNOSIS — M533 Sacrococcygeal disorders, not elsewhere classified: Secondary | ICD-10-CM

## 2019-09-18 DIAGNOSIS — M999 Biomechanical lesion, unspecified: Secondary | ICD-10-CM

## 2019-09-18 NOTE — Assessment & Plan Note (Signed)
Patient does have more of a sacroiliac dysfunction still noted.  Patient has had some psoriatic potential arthritis as well as osteoporosis.  We are awaiting patient's bone density.  Patient does respond well to osteopathic manipulation will follow-up in 6 weeks.

## 2019-09-18 NOTE — Assessment & Plan Note (Signed)
Decision today to treat with OMT was based on Physical Exam  After verbal consent patient was treated with HVLA, ME, FPR techniques in cervical, thoracic, rib and pelvis  lumbar and sacral areas  Patient tolerated the procedure well with improvement in symptoms  Patient given exercises, stretches and lifestyle modifications  See medications in patient instructions if given  Patient will follow up in 6-8 weeks

## 2019-10-02 ENCOUNTER — Ambulatory Visit (INDEPENDENT_AMBULATORY_CARE_PROVIDER_SITE_OTHER): Payer: 59 | Admitting: Psychology

## 2019-10-02 DIAGNOSIS — F4323 Adjustment disorder with mixed anxiety and depressed mood: Secondary | ICD-10-CM

## 2019-10-30 ENCOUNTER — Ambulatory Visit: Payer: 59 | Admitting: Family Medicine

## 2019-10-30 ENCOUNTER — Encounter: Payer: Self-pay | Admitting: Family Medicine

## 2019-10-30 ENCOUNTER — Other Ambulatory Visit: Payer: Self-pay

## 2019-10-30 ENCOUNTER — Ambulatory Visit (INDEPENDENT_AMBULATORY_CARE_PROVIDER_SITE_OTHER): Payer: Managed Care, Other (non HMO) | Admitting: Psychology

## 2019-10-30 VITALS — BP 108/72 | HR 97 | Ht 66.5 in | Wt 134.0 lb

## 2019-10-30 DIAGNOSIS — F4323 Adjustment disorder with mixed anxiety and depressed mood: Secondary | ICD-10-CM

## 2019-10-30 DIAGNOSIS — M999 Biomechanical lesion, unspecified: Secondary | ICD-10-CM

## 2019-10-30 DIAGNOSIS — M940 Chondrocostal junction syndrome [Tietze]: Secondary | ICD-10-CM

## 2019-10-30 DIAGNOSIS — M25511 Pain in right shoulder: Secondary | ICD-10-CM | POA: Diagnosis not present

## 2019-10-30 NOTE — Patient Instructions (Addendum)
  [address], [address] [place], [place] [address] Phone [phone]  Trigger point injections today Take naproxen twice a day for 10 days See me again in 4 weeks

## 2019-10-30 NOTE — Progress Notes (Signed)
[name] [address] [place], [place] [address] Phone: [phone] Subjective:   [name], am serving as a scribe for Dr. Hulan Saas.  This visit occurred during the SARS-CoV-2 public health emergency.  Safety protocols were in place, including screening questions prior to the visit, additional usage of staff PPE, and extensive cleaning of exam room while observing appropriate contact time as indicated for disinfecting solutions.   I'm seeing this patient by the request  of:    CC: Back pain follow-up  [id number]   09/18/2019 OMT  Update 10/30/2019 Criss Dispenza is a 33 y.o. female coming in with complaint of back pain. Patient states that she has good days and bad days. Patient states continues to have pain.  Feels it is frustrating.  Feels like sometimes she has really good days and then unfortunately worsening pain again.  Patient's feels that unfortunately does not know real reason why.  Not overeating some allergy testing to come back to see if there is any other concerns.  Patient did have a stent did take anti-inflammatories did help.     Past Medical History:  Diagnosis Date  . Chicken pox   . Heart murmur   . History of lumbar compression fracture 06/27/2018   Compression fracture L2-L3 Dexa done 07/05/17 report in chart CT lumbar report from 04/22/17 in chart as well as xray from 04/19/17  . Lactose intolerance 07/11/2018   Age 24  . Osteoporosis, followed by Dr. Buddy Duty, thought to be due to Accutane 07/27/2017   Done density done 07/05/18 report in chart.   . Psoriasis 07/11/2018  . 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 KNEE ARTHROSCOPY Right   . WISDOM TOOTH EXTRACTION  2005   Social History   Socioeconomic History  . Marital status: Married    Spouse name:  Not on file  . Number of children: 0  . Years of education: Not on file  . Highest education level: Not on file  Occupational History  . Occupation: Human resources officer: [employer]  Tobacco Use  . Smoking status: Never Smoker  . Smokeless tobacco: Never Used  Substance and Sexual Activity  . Alcohol use: Yes    Alcohol/week: 2.0 standard drinks    Types: 2 Glasses of wine per week    Comment: twice a month  . Drug use: Never  . Sexual activity: Yes    Partners: Male    Birth control/protection: Rhythm, Condom  Other Topics Concern  . 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 Difficulty of Paying Living Expenses: Not on file  Food Insecurity:   . Worried About Charity fundraiser in the Last Year: Not on file  . Ran Out of Food in the Last Year: Not on file  Transportation Needs:   . Lack of Transportation (Medical): Not on file  . Lack of Transportation (Non-Medical): Not on file  Physical Activity:   . Days of Exercise per Week: Not on file  . Minutes of Exercise per Session: Not on file  Stress:   . Feeling of Stress : Not on file  Social  Connections:   . Frequency of Communication with Friends and Family: Not on file  . Frequency of Social Gatherings with Friends and Family: Not on file  . Attends Religious Services: Not on file  . Active Member of Clubs or Organizations: Not on file  . Attends Archivist Meetings: Not on file  . Marital Status: Not on file   Allergies  Allergen Reactions  . Morphine Rash    Age 70 said felt like skin burning tolerated hydrocodone in past    Family History  Problem Relation Age of Onset  . Depression Mother   . Miscarriages / [place] Mother   . High Cholesterol Father   . Early death Maternal Grandmother        Brain aneurysm   . Cancer Maternal  Grandfather   . COPD Paternal Grandmother   . Heart attack Paternal Grandmother         Current Outpatient Medications (Hematological):  Marland Kitchen  Polysaccharide Iron Complex (NU-IRON PO), Take 1 tablet by mouth daily.   Current Outpatient Medications (Other):  Marland Kitchen  Calcium Carb-Cholecalciferol (CALCIUM-VITAMIN D) 500-200 MG-UNIT tablet, Take by mouth. .  cholecalciferol (VITAMIN D) 1000 units tablet, Take 2,000 Units by mouth daily. .  cyclobenzaprine (FLEXERIL) 5 MG tablet, Take 1 tablet (5 mg total) by mouth 3 (three) times daily as needed for muscle spasms. .  vitamin C (ASCORBIC ACID) 500 MG tablet, Take 500 mg by mouth daily.    Past medical history, social, surgical and family history all reviewed in electronic medical record.  No pertanent information unless stated regarding to the chief complaint.   Review of Systems:  No headache, visual changes, nausea, vomiting, diarrhea, constipation, dizziness, abdominal pain, skin rash, fevers, chills, night sweats, weight loss, swollen lymph nodes, body aches, joint swelling, muscle aches, chest pain, shortness of breath, mood changes.   Objective  There were no vitals taken for this visit. Systems examined below as of    General: No apparent distress alert and oriented x3 mood and affect normal, dressed appropriately.  HEENT: Pupils equal, extraocular movements intact  Respiratory: Patient's speak in full sentences and does not appear short of breath  Cardiovascular: No lower extremity edema, non tender, no erythema  Skin: Warm dry intact with no signs of infection or rash on extremities or on axial skeleton.  Abdomen: Soft nontender  Neuro: Cranial nerves II through XII are intact, neurovascularly intact in all extremities with 2+ DTRs and 2+ pulses.  Lymph: No lymphadenopathy of posterior or anterior cervical chain or axillae bilaterally.  Gait normal with good balance and coordination.  MSK:  Non tender with full range of motion and  good stability and symmetric strength and tone of shoulders, elbows, wrist, hip, knee and ankles bilaterally.  Mild hypermobility  Neck exam does have very mild loss of lordosis.  Significant tightness with multiple trigger points in the right shoulder girdle with the trapezius muscle, rhomboid muscle, and scalene muscle.  Negative Spurling's maneuver.  Low back exam has tightness noted around the sacroiliac joint right greater than left.  Negative straight leg test.  Tender to palpation in this area as well as the paraspinal musculature mostly from L3-L4.   Osteopathic findings  C4 flexed rotated and side bent left C7 flexed rotated and side bent left T3 extended rotated and side bent right inhaled third rib T9 extended rotated and side bent left L2 flexed rotated and side bent right Sacrum right on right  After verbal consent patient  was prepped with alcohol swabs and with a 25-gauge half inch needle injected into 3 distinct trigger points in 3 distinct muscles including the rhomboid, scalene, and trapezius muscle.  Total of 3 cc of 0.5% Marcaine and 1 cc of Kenalog 40 mg/mL used.    Impression and Recommendations:     This case required medical decision making of moderate complexity. The above documentation has been reviewed and is accurate and complete Lyndal Pulley, DO       Note: This dictation was prepared with Dragon dictation along with smaller phrase technology. Any transcriptional errors that result from this process are unintentional.

## 2019-10-30 NOTE — Assessment & Plan Note (Signed)
Continues in discomfort and pain.  Pain in the sacroiliac joint as well.  Patient has had a history of lumbar compression as well.  Discussed with patient about the other possibilities such injections or radiofrequency ablation.  Patient is going to increase activity as tolerated.  Follow-up again 4 to 8 weeks.

## 2019-10-30 NOTE — Assessment & Plan Note (Signed)
Patient given trigger point injections today.  Tolerated the procedure well.  Discussed the potential for side effects.  Patient has muscle axis, will give him a short course of scheduled anti-inflammatories to see if this will be beneficial as well.  Follow-up again in 4 to 8 weeks

## 2019-10-30 NOTE — Assessment & Plan Note (Signed)
Decision today to treat with OMT was based on Physical Exam  After verbal consent patient was treated with HVLA, ME, FPR techniques in cervical, thoracic, rib lumbar and sacral areas  Patient tolerated the procedure well with improvement in symptoms  Patient given exercises, stretches and lifestyle modifications  See medications in patient instructions if given  Patient will follow up in 4-8 weeks 

## 2019-10-31 ENCOUNTER — Encounter: Payer: Self-pay | Admitting: Family Medicine

## 2019-10-31 ENCOUNTER — Other Ambulatory Visit: Payer: Self-pay

## 2019-10-31 MED ORDER — NAPROXEN 375 MG PO TABS: 375.0000 mg | ORAL_TABLET | Freq: Two times a day (BID) | ORAL | 3 refills | Status: DC

## 2019-11-20 DIAGNOSIS — M6281 Muscle weakness (generalized): Secondary | ICD-10-CM | POA: Diagnosis not present

## 2019-11-20 DIAGNOSIS — M999 Biomechanical lesion, unspecified: Secondary | ICD-10-CM | POA: Diagnosis not present

## 2019-11-20 DIAGNOSIS — M549 Dorsalgia, unspecified: Secondary | ICD-10-CM | POA: Diagnosis not present

## 2019-11-27 ENCOUNTER — Ambulatory Visit: Payer: 59 | Admitting: Family Medicine

## 2019-12-11 ENCOUNTER — Encounter: Payer: Self-pay | Admitting: Family Medicine

## 2019-12-11 ENCOUNTER — Other Ambulatory Visit: Payer: Self-pay

## 2019-12-11 ENCOUNTER — Ambulatory Visit: Payer: Federal, State, Local not specified - PPO | Admitting: Family Medicine

## 2019-12-11 ENCOUNTER — Ambulatory Visit (INDEPENDENT_AMBULATORY_CARE_PROVIDER_SITE_OTHER): Payer: Federal, State, Local not specified - PPO | Admitting: Psychology

## 2019-12-11 VITALS — BP 110/80 | HR 73 | Ht 66.5 in | Wt 130.0 lb

## 2019-12-11 DIAGNOSIS — F4323 Adjustment disorder with mixed anxiety and depressed mood: Secondary | ICD-10-CM

## 2019-12-11 DIAGNOSIS — M533 Sacrococcygeal disorders, not elsewhere classified: Secondary | ICD-10-CM | POA: Diagnosis not present

## 2019-12-11 DIAGNOSIS — M999 Biomechanical lesion, unspecified: Secondary | ICD-10-CM | POA: Diagnosis not present

## 2019-12-11 NOTE — Assessment & Plan Note (Signed)
Decision today to treat with OMT was based on Physical Exam  After verbal consent patient was treated with HVLA, ME, FPR techniques in cervical, thoracic, rib,  lumbar and sacral areas  Patient tolerated the procedure well with improvement in symptoms  Patient given exercises, stretches and lifestyle modifications  See medications in patient instructions if given  Patient will follow up in 4-8 weeks 

## 2019-12-11 NOTE — Patient Instructions (Signed)
Good to see you See me again in 2-3 months 

## 2019-12-11 NOTE — Assessment & Plan Note (Signed)
Patient is doing remarkably better at this time.  Diet changes have made a significant difference.  Patient is to increase activity slowly.  Discussed which activities of doing which wants to avoid.  Patient will increase activity slowly.  Follow-up again in 4 to 8 weeks

## 2019-12-11 NOTE — Progress Notes (Signed)
[facility] [place] Phone: [phone] Subjective:   [name], am serving as a scribe for Dr. Hulan Saas. This visit occurred during the SARS-CoV-2 public health emergency.  Safety protocols were in place, including screening questions prior to the visit, additional usage of staff PPE, and extensive cleaning of exam room while observing appropriate contact time as indicated for disinfecting solutions.   I'm seeing this patient by the request  of:  Inda Coke, [place]  CC: Back pain follow-up  [id number]  Timberlyn Shippee is a 34 y.o. female coming in with complaint of back pain. Last seen on 10/30/2019 for OMT. Patient states that she did take the 10 days of naproxen which helped. Did a food sensitivity test and has eliminated most of her high and moderate sensitivities. Feels the best she has felt in years.  Patient has been doing very well at the time.  Patient denies any new findings.      Past Medical History:  Diagnosis Date  . Chicken pox   . Heart murmur   . History of lumbar compression fracture 06/27/2018   Compression fracture L2-L3 Dexa done 07/05/17 report in chart CT lumbar report from 04/22/17 in chart as well as xray from 04/19/17  . Lactose intolerance 07/11/2018   Age 34  . Osteoporosis, followed by Dr. Buddy Duty, thought to be due to Accutane 07/27/2017   Done density done 07/05/18 report in chart.   . Psoriasis 07/11/2018  . 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 KNEE ARTHROSCOPY Right   . WISDOM TOOTH EXTRACTION  2005   Social History   Socioeconomic History  . Marital status: Married    Spouse name: Not on file  . Number of children: 0  . Years of education: Not on file  . Highest education level: Not on file  Occupational  History  . Occupation: Human resources officer: [employer]  Tobacco Use  . Smoking status: Never Smoker  . Smokeless tobacco: Never Used  Substance and Sexual Activity  . Alcohol use: Yes    Alcohol/week: 2.0 standard drinks    Types: 2 Glasses of wine per week    Comment: twice a month  . Drug use: Never  . Sexual activity: Yes    Partners: Male    Birth control/protection: Rhythm, Condom  Other Topics Concern  . 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 Difficulty of Paying Living Expenses: Not on file  Food Insecurity:   . Worried About Charity fundraiser in the Last Year: Not on file  . Ran Out of Food in the Last Year: Not on file  Transportation Needs:   . Lack of Transportation (Medical): Not on file  . Lack of Transportation (Non-Medical): Not on file  Physical Activity:   . Days of Exercise per Week: Not on file  . Minutes of Exercise per Session: Not on file  Stress:   . Feeling of Stress : Not on file  Social Connections:   . Frequency of Communication with Friends and Family: Not on file  . Frequency of Social Gatherings with Friends  and Family: Not on file  . Attends Religious Services: Not on file  . Active Member of Clubs or Organizations: Not on file  . Attends Archivist Meetings: Not on file  . Marital Status: Not on file   Allergies  Allergen Reactions  . Morphine Rash    Age 61 said felt like skin burning tolerated hydrocodone in past    Family History  Problem Relation Age of Onset  . Depression Mother   . Miscarriages / [place] Mother   . High Cholesterol Father   . Early death Maternal Grandmother        Brain aneurysm   . Cancer Maternal Grandfather   . COPD Paternal Grandmother   . Heart attack Paternal Grandmother        Current Outpatient Medications  (Analgesics):  .  naproxen (NAPROSYN) 375 MG tablet, Take 1 tablet (375 mg total) by mouth 2 (two) times daily with a meal.  Current Outpatient Medications (Hematological):  Marland Kitchen  Polysaccharide Iron Complex (NU-IRON PO), Take 1 tablet by mouth daily.   Current Outpatient Medications (Other):  Marland Kitchen  Calcium Carb-Cholecalciferol (CALCIUM-VITAMIN D) 500-200 MG-UNIT tablet, Take by mouth. .  cholecalciferol (VITAMIN D) 1000 units tablet, Take 2,000 Units by mouth daily. .  cyclobenzaprine (FLEXERIL) 5 MG tablet, Take 1 tablet (5 mg total) by mouth 3 (three) times daily as needed for muscle spasms. .  Prenatal Vit-Fe Fumarate-FA (PRENATAL MULTIVITAMIN) TABS tablet, Take 1 tablet by mouth daily at 12 noon. .  vitamin C (ASCORBIC ACID) 500 MG tablet, Take 500 mg by mouth daily.    Past medical history, social, surgical and family history all reviewed in electronic medical record.  No pertanent information unless stated regarding to the chief complaint.   Review of Systems:  No headache, visual changes, nausea, vomiting, diarrhea, constipation, dizziness, abdominal pain, skin rash, fevers, chills, night sweats, weight loss, swollen lymph nodes, body aches, joint swelling, chest pain, shortness of breath, mood changes. POSITIVE muscle aches  Objective  Blood pressure 110/80, pulse 73, height 5' 6.5" (1.689 m), weight 130 lb (59 kg), SpO2 98 %.   General: No apparent distress alert and oriented x3 mood and affect normal, dressed appropriately.  HEENT: Pupils equal, extraocular movements intact  Respiratory: Patient's speak in full sentences and does not appear short of breath  Cardiovascular: No lower extremity edema, non tender, no erythema  Skin: Warm dry intact with no signs of infection or rash on extremities or on axial skeleton.  Abdomen: Soft nontender  Neuro: Cranial nerves II through XII are intact, neurovascularly intact in all extremities with 2+ DTRs and 2+ pulses.  Lymph: No  lymphadenopathy of posterior or anterior cervical chain or axillae bilaterally.  Gait normal with good balance and coordination.  MSK:  tender with full range of motion and good stability and symmetric strength and tone of shoulders, elbows, wrist, hip, knee and ankles bilaterally.  Back Exam:  Inspection: Mild loss of lordosis Motion: Flexion 45 deg, Extension 25 deg, Side Bending to 35 deg bilaterally,  Rotation to 45 deg bilaterally  SLR laying: Negative  XSLR laying: Negative  Palpable tenderness: mild tenderness to palpation in the paraspinal musculature mostly in the thoracolumbar juncture and mild over the sacroiliac joint still e. FABER: negative. Sensory change: Gross sensation intact to all lumbar and sacral dermatomes.  Reflexes: 2+ at both patellar tendons, 2+ at achilles tendons, Babinski's downgoing.  Strength at foot  Plantar-flexion: 5/5 Dorsi-flexion: 5/5 Eversion: 5/5 Inversion: 5/5  Leg strength  Quad: 5/5 Hamstring: 5/5 Hip flexor: 5/5 Hip abductors: 5/5    Osteopathic findings  C6 flexed rotated and side bent left T5 extended rotated and side bent right inhaled rib T7 extended rotated and side bent left L2 flexed rotated and side bent right Sacrum right on right    Impression and Recommendations:     This case required medical decision making of moderate complexity. The above documentation has been reviewed and is accurate and complete Lyndal Pulley, DO       Note: This dictation was prepared with Dragon dictation along with smaller phrase technology. Any transcriptional errors that result from this process are unintentional.

## 2019-12-18 DIAGNOSIS — M999 Biomechanical lesion, unspecified: Secondary | ICD-10-CM | POA: Diagnosis not present

## 2019-12-18 DIAGNOSIS — M549 Dorsalgia, unspecified: Secondary | ICD-10-CM | POA: Diagnosis not present

## 2019-12-18 DIAGNOSIS — M6281 Muscle weakness (generalized): Secondary | ICD-10-CM | POA: Diagnosis not present

## 2019-12-26 ENCOUNTER — Encounter: Payer: Self-pay | Admitting: Physician Assistant

## 2019-12-26 NOTE — Telephone Encounter (Signed)
Please call patient and schedule virtual visit with [facility].

## 2019-12-26 NOTE — Telephone Encounter (Signed)
Called pt and LVM to schedule appt

## 2019-12-27 ENCOUNTER — Encounter: Payer: Self-pay | Admitting: Physician Assistant

## 2019-12-27 ENCOUNTER — Ambulatory Visit (INDEPENDENT_AMBULATORY_CARE_PROVIDER_SITE_OTHER): Payer: Federal, State, Local not specified - PPO | Admitting: Physician Assistant

## 2019-12-27 VITALS — Temp 97.5°F | Ht 66.5 in | Wt 130.0 lb

## 2019-12-27 DIAGNOSIS — J029 Acute pharyngitis, unspecified: Secondary | ICD-10-CM | POA: Diagnosis not present

## 2019-12-27 NOTE — Progress Notes (Signed)
Virtual Visit via Video   I connected with [name] on 12/27/19 at 11:20 AM EST by a video enabled telemedicine application and verified that I am speaking with the correct person using two identifiers. Location patient: Home Location provider: [facility], Office Persons participating in the virtual visit: Chrystian Carrothers, Inda Coke PA-C, Anselmo Pickler, LPN   I discussed the limitations of evaluation and management by telemedicine and the availability of in person appointments. The patient expressed understanding and agreed to proceed.  I acted as a Education administrator for [employer], PA-C [employer], LPN  Subjective:   HPI:   Patient is requesting evaluation for possible COVID-19.  Symptom onset: 3-4 weeks, worse past 3-4 days.  Travel/contacts: No travel or exposure  Patient endorses the following symptoms: ear fullness and sore throat  Patient denies the following symptoms: subjective fever, sinus headache, sinus congestion, rhinorrhea, itchy watery eyes, ear pain, difficulty swallowing, wheezing, shortness of breath, chest tightness, chest pain and myalgia  Treatments tried: none  Patient risk factors: Current [id number] risk of complications score: 0 Smoking status: [name]  reports that she has never smoked. She has never used smokeless tobacco. If female, currently pregnant? []   Yes [x]   No  Patient's last menstrual period was 11/29/2019.   ROS: See pertinent positives and negatives per HPI.  Patient Active Problem List   Diagnosis Date Noted  . Trigger point of right shoulder region 10/30/2019  . Contusion of dorsum of foot 11/28/2018  . Heart murmur 07/11/2018  . Lactose intolerance 07/11/2018  . Psoriasis 07/11/2018  . Family history of thyroid disease 07/11/2018  . History of lumbar compression fracture 06/27/2018  . 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 Nonallopathic lesion of cervical region 05/30/2018  . Nonallopathic lesion of rib  cage 05/30/2018  . Nonallopathic lesion of thoracic region 11/01/2017  . Osteoporosis, followed by Dr. Buddy Duty, thought to be due to Accutane 07/27/2017  . 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 Nonallopathic lesion of lumbosacral region 07/12/2017  . Nonallopathic lesion of sacral region 07/12/2017  . Nonallopathic lesion of pelvic region 07/12/2017    Social History   Tobacco Use  . Smoking status: Never Smoker  . Smokeless tobacco: Never Used  Substance Use Topics  . Alcohol use: Yes    Alcohol/week: 2.0 standard drinks    Types: 2 Glasses of wine per week    Comment: twice a month    Current Outpatient Medications:  .  Calcium Carb-Cholecalciferol (CALCIUM-VITAMIN D) 500-200 MG-UNIT tablet, Take by mouth., Disp: , Rfl:  .  cholecalciferol (VITAMIN D) 1000 units tablet, Take 2,000 Units by mouth daily., Disp: , Rfl:  .  cyclobenzaprine (FLEXERIL) 5 MG tablet, Take 1 tablet (5 mg total) by mouth 3 (three) times daily as needed for muscle spasms., Disp: 30 tablet, Rfl: 0 .  Polysaccharide Iron Complex (NU-IRON PO), Take 1 tablet by mouth daily. , Disp: , Rfl:  .  Prenatal Vit-Fe Fumarate-FA (PRENATAL MULTIVITAMIN) TABS tablet, Take 1 tablet by mouth daily at 12 noon., Disp: , Rfl:   Allergies  Allergen Reactions  . Morphine Rash    Age 39 said felt like skin burning tolerated hydrocodone in past     Objective:   VITALS: Per patient if applicable, see vitals. GENERAL: Alert, appears well and in no acute distress. HEENT: Atraumatic, conjunctiva clear, no obvious abnormalities on inspection of external nose and ears. NECK: Normal movements of the head and neck. CARDIOPULMONARY: No increased WOB. Speaking in clear  sentences. I:E ratio WNL.  MS: Moves all visible extremities without noticeable abnormality. PSYCH: Pleasant and cooperative, well-groomed. Speech normal rate and rhythm. Affect is appropriate. Insight and judgement are appropriate. Attention is focused, linear,  and appropriate.  NEURO: CN grossly intact. Oriented as arrived to appointment on time with no prompting. Moves both UE equally.  SKIN: No obvious lesions, wounds, erythema, or cyanosis noted on face or hands.  Assessment and Plan:   [place] was seen today for covid symptoms.  Diagnoses and all orders for this visit:  Pharyngitis, unspecified etiology   Patient has a respiratory illness without signs of acute distress or respiratory compromise at this time. This is likely a viral infection, which can come from a number of respiratory viruses.   Did discuss that with her sx I cannot r/o COVID-19. Continue to follow CDC recommendations -- washing hands, wearing masks, avoiding non essential places. Close follow-up if any changes in symptoms. She declines COVID-19 testing at this time.  Symptoms are improved today. Will proceed with watchful waiting. May trial prn advil and/or OTC antacid medication, such as prilosec/pepcid. We discussed not using either of these types of medications long term due to side effects, including GI irritation/GERD with advil and worsening bone density/mineralization with PPIs.  . Reviewed expectations re: course of current medical issues. . Discussed self-management of symptoms. . Outlined signs and symptoms indicating need for more acute intervention. . Patient verbalized understanding and all questions were answered. Marland Kitchen Health Maintenance issues including appropriate healthy diet, exercise, and smoking avoidance were discussed with patient. . See orders for this visit as documented in the electronic medical record.  I discussed the assessment and treatment plan with the patient. The patient was provided an opportunity to ask questions and all were answered. The patient agreed with the plan and demonstrated an understanding of the instructions.   The patient was advised to call back or seek an in-person evaluation if the symptoms worsen or if the condition fails  to improve as anticipated.   CMA or LPN served as scribe during this visit. History, Physical, and Plan performed by medical provider. The above documentation has been reviewed and is accurate and complete.   [place], [place] 12/27/2019

## 2020-01-01 DIAGNOSIS — M6281 Muscle weakness (generalized): Secondary | ICD-10-CM | POA: Diagnosis not present

## 2020-01-01 DIAGNOSIS — M549 Dorsalgia, unspecified: Secondary | ICD-10-CM | POA: Diagnosis not present

## 2020-01-01 DIAGNOSIS — M999 Biomechanical lesion, unspecified: Secondary | ICD-10-CM | POA: Diagnosis not present

## 2020-01-15 DIAGNOSIS — M549 Dorsalgia, unspecified: Secondary | ICD-10-CM | POA: Diagnosis not present

## 2020-01-15 DIAGNOSIS — M6281 Muscle weakness (generalized): Secondary | ICD-10-CM | POA: Diagnosis not present

## 2020-01-15 DIAGNOSIS — M999 Biomechanical lesion, unspecified: Secondary | ICD-10-CM | POA: Diagnosis not present

## 2020-01-22 ENCOUNTER — Ambulatory Visit (INDEPENDENT_AMBULATORY_CARE_PROVIDER_SITE_OTHER): Payer: Federal, State, Local not specified - PPO | Admitting: Psychology

## 2020-01-22 DIAGNOSIS — M999 Biomechanical lesion, unspecified: Secondary | ICD-10-CM | POA: Diagnosis not present

## 2020-01-22 DIAGNOSIS — M549 Dorsalgia, unspecified: Secondary | ICD-10-CM | POA: Diagnosis not present

## 2020-01-22 DIAGNOSIS — F4323 Adjustment disorder with mixed anxiety and depressed mood: Secondary | ICD-10-CM

## 2020-01-22 DIAGNOSIS — M6281 Muscle weakness (generalized): Secondary | ICD-10-CM | POA: Diagnosis not present

## 2020-01-24 DIAGNOSIS — M4846XD Fatigue fracture of vertebra, lumbar region, subsequent encounter for fracture with routine healing: Secondary | ICD-10-CM | POA: Diagnosis not present

## 2020-01-24 DIAGNOSIS — R82994 Hypercalciuria: Secondary | ICD-10-CM | POA: Diagnosis not present

## 2020-01-24 DIAGNOSIS — M8580 Other specified disorders of bone density and structure, unspecified site: Secondary | ICD-10-CM | POA: Diagnosis not present

## 2020-02-01 ENCOUNTER — Ambulatory Visit (INDEPENDENT_AMBULATORY_CARE_PROVIDER_SITE_OTHER): Payer: Federal, State, Local not specified - PPO | Admitting: Psychology

## 2020-02-01 DIAGNOSIS — F4323 Adjustment disorder with mixed anxiety and depressed mood: Secondary | ICD-10-CM | POA: Diagnosis not present

## 2020-02-05 ENCOUNTER — Ambulatory Visit (INDEPENDENT_AMBULATORY_CARE_PROVIDER_SITE_OTHER): Payer: Federal, State, Local not specified - PPO | Admitting: Family Medicine

## 2020-02-05 ENCOUNTER — Other Ambulatory Visit: Payer: Self-pay

## 2020-02-05 ENCOUNTER — Encounter: Payer: Self-pay | Admitting: Family Medicine

## 2020-02-05 DIAGNOSIS — M999 Biomechanical lesion, unspecified: Secondary | ICD-10-CM | POA: Diagnosis not present

## 2020-02-05 DIAGNOSIS — M533 Sacrococcygeal disorders, not elsewhere classified: Secondary | ICD-10-CM

## 2020-02-05 NOTE — Progress Notes (Signed)
[place] Phone: [phone] Subjective:   Allison [name], am serving as a scribe for Dr. Hulan Saas. This visit occurred during the SARS-CoV-2 public health emergency.  Safety protocols were in place, including screening questions prior to the visit, additional usage of staff PPE, and extensive cleaning of exam room while observing appropriate contact time as indicated for disinfecting solutions.   I'm seeing this patient by the request  of:  Inda Coke, [place]  CC: Low back pain follow-up  [id number]  Allison [name] is a 34 y.o. female coming in with complaint of back pain. Last seen on 12/11/2019 for OMT. Patient has been doing physical therapy. Patient states that she has been having left side piriformis pain that radiates down into left hamstring.  More radicular symptoms as well.  Patient states that it seems more of a tightness.  Patient denies any true injury though.  Patient states that it is more annoying.  Hoping that the manipulation would be helpful.      Past Medical History:  Diagnosis Date  . Chicken pox   . Heart murmur   . History of lumbar compression fracture 06/27/2018   Compression fracture L2-L3 Dexa done 07/05/17 report in chart CT lumbar report from 04/22/17 in chart as well as xray from 04/19/17  . Lactose intolerance 07/11/2018   Age 84  . Osteoporosis, followed by Dr. Buddy Duty, thought to be due to Accutane 07/27/2017   Done density done 07/05/18 report in chart.   . Psoriasis 07/11/2018  . 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 KNEE ARTHROSCOPY Right   . WISDOM TOOTH EXTRACTION  2005   Social History   Socioeconomic History  . Marital status: Married    Spouse name: Not on file  . Number of children: 0  . Years of  education: Not on file  . Highest education level: Not on file  Occupational History  . Occupation: Human resources officer: [employer]  Tobacco Use  . Smoking status: Never Smoker  . Smokeless tobacco: Never Used  Substance and Sexual Activity  . Alcohol use: Yes    Alcohol/week: 2.0 standard drinks    Types: 2 Glasses of wine per week    Comment: twice a month  . Drug use: Never  . Sexual activity: Yes    Partners: Male    Birth control/protection: Rhythm, Condom  Other Topics Concern  . 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 Difficulty of Paying Living Expenses:   Food Insecurity:   . Worried About Charity fundraiser in the Last Year:   . Arboriculturist in the Last Year:   Transportation Needs:   . Film/video editor (Medical):   Marland Kitchen Lack of Transportation (Non-Medical):   Physical Activity:   . Days of Exercise per Week:   . Minutes of Exercise per Session:   Stress:   . Feeling of Stress :   Social Connections:   . Frequency of Communication with Friends and Family:   . Frequency of Social Gatherings with Friends and Family:   . Attends Religious  Services:   . Active Member of Clubs or Organizations:   . Attends Archivist Meetings:   Marland Kitchen Marital Status:    Allergies  Allergen Reactions  . Morphine Rash    Age 34 said felt like skin burning tolerated hydrocodone in past    Family History  Problem Relation Age of Onset  . Depression Mother   . Miscarriages / [place] Mother   . High Cholesterol Father   . Early death Maternal Grandmother        Brain aneurysm   . Cancer Maternal Grandfather   . COPD Paternal Grandmother   . Heart attack Paternal Grandmother         Current Outpatient Medications (Hematological):  Marland Kitchen  Polysaccharide Iron Complex (NU-IRON PO), Take 1 tablet by  mouth daily.   Current Outpatient Medications (Other):  Marland Kitchen  Calcium Carb-Cholecalciferol (CALCIUM-VITAMIN D) 500-200 MG-UNIT tablet, Take by mouth. .  cholecalciferol (VITAMIN D) 1000 units tablet, Take 2,000 Units by mouth daily. .  cyclobenzaprine (FLEXERIL) 5 MG tablet, Take 1 tablet (5 mg total) by mouth 3 (three) times daily as needed for muscle spasms. .  Prenatal Vit-Fe Fumarate-FA (PRENATAL MULTIVITAMIN) TABS tablet, Take 1 tablet by mouth daily at 12 noon.   Reviewed prior external information including notes and imaging from  primary care provider As well as notes that were available from care everywhere and other healthcare systems.  Past medical history, social, surgical and family history all reviewed in electronic medical record.  [name] unless stated regarding to the chief complaint.   Review of Systems:  [name] headache, visual changes, nausea, vomiting, diarrhea, constipation, dizziness, abdominal pain, skin rash, fevers, chills, night sweats, weight loss, swollen lymph nodes, body aches, joint swelling, chest pain, shortness of breath, mood changes. POSITIVE muscle aches  Objective  Blood pressure 110/72, pulse 100, height 5' 6.5" (1.689 m), weight 125 lb (56.7 kg), SpO2 97 %.   General: [name] apparent distress alert and oriented x3 mood and affect normal, dressed appropriately.  HEENT: Pupils equal, extraocular movements intact  Respiratory: Patient's speak in full sentences and does not appear short of breath  Cardiovascular: [name] lower extremity edema, non tender, [name] erythema  Neuro: Cranial nerves II through XII are intact, neurovascularly intact in all extremities with 2+ DTRs and 2+ pulses.  Gait normal with good balance and coordination.  MSK:  Non tender with full range of motion and good stability and symmetric strength and tone of shoulders, elbows, wrist, hip, knee and ankles bilaterally.  Low back exam does have some mild loss of lordosis, some  tightness noted in the paraspinal musculature lumbar on left greater than right.  Patient does have good core strengthening seems to be doing relatively well.  Negative straight leg test.  5 out of 5 strength noted but does have some tightness in the hamstring.  Osteopathic findings C6 flexed rotated and side bent left T3 extended rotated and side bent right inhaled third rib T9 extended rotated and side bent left L2 flexed rotated and side bent right Sacrum right on right Pelvic shear noted left   Impression and Recommendations:     This case required medical decision making of moderate complexity. The above documentation has been reviewed and is accurate and complete Lyndal Pulley, DO       Note: This dictation was prepared with Dragon dictation along with smaller phrase technology. Any transcriptional errors that result from this process are unintentional.

## 2020-02-05 NOTE — Assessment & Plan Note (Signed)
   Decision today to treat with OMT was based on Physical Exam  After verbal consent patient was treated with  ME, FPR techniques in cervical, thoracic, rib, lumbar and sacral, pelvis areas, all areas are chronic   Patient tolerated the procedure well with improvement in symptoms  Patient given exercises, stretches and lifestyle modifications  See medications in patient instructions if given  Patient will follow up in 4-8 weeks

## 2020-02-05 NOTE — Assessment & Plan Note (Signed)
Chronic problem.  Mild exacerbation, rotation is causing some tightness on the hamstring.  Could be concerning for a differential of lumbar radiculopathy.  Medication management we discussed the Flexeril for as needed.  Patient responds well to manipulation.  Core strengthening.  Follow-up again in 4 to 8 weeks

## 2020-02-05 NOTE — Patient Instructions (Signed)
Shorten stride with running Thigh compression sleeve with running Otherwise see me in 5-6 weeks

## 2020-02-12 DIAGNOSIS — M549 Dorsalgia, unspecified: Secondary | ICD-10-CM | POA: Diagnosis not present

## 2020-02-12 DIAGNOSIS — M6281 Muscle weakness (generalized): Secondary | ICD-10-CM | POA: Diagnosis not present

## 2020-02-15 ENCOUNTER — Ambulatory Visit (INDEPENDENT_AMBULATORY_CARE_PROVIDER_SITE_OTHER): Payer: Federal, State, Local not specified - PPO | Admitting: Psychology

## 2020-02-15 DIAGNOSIS — F4323 Adjustment disorder with mixed anxiety and depressed mood: Secondary | ICD-10-CM | POA: Diagnosis not present

## 2020-03-04 ENCOUNTER — Ambulatory Visit (INDEPENDENT_AMBULATORY_CARE_PROVIDER_SITE_OTHER): Payer: Federal, State, Local not specified - PPO | Admitting: Psychology

## 2020-03-04 DIAGNOSIS — F4323 Adjustment disorder with mixed anxiety and depressed mood: Secondary | ICD-10-CM | POA: Diagnosis not present

## 2020-03-18 ENCOUNTER — Encounter: Payer: Self-pay | Admitting: Family Medicine

## 2020-03-18 DIAGNOSIS — M549 Dorsalgia, unspecified: Secondary | ICD-10-CM | POA: Diagnosis not present

## 2020-03-18 DIAGNOSIS — M6281 Muscle weakness (generalized): Secondary | ICD-10-CM | POA: Diagnosis not present

## 2020-03-18 DIAGNOSIS — S32000A Wedge compression fracture of unspecified lumbar vertebra, initial encounter for closed fracture: Secondary | ICD-10-CM | POA: Diagnosis not present

## 2020-03-18 DIAGNOSIS — M999 Biomechanical lesion, unspecified: Secondary | ICD-10-CM | POA: Diagnosis not present

## 2020-03-25 ENCOUNTER — Ambulatory Visit (INDEPENDENT_AMBULATORY_CARE_PROVIDER_SITE_OTHER): Payer: Federal, State, Local not specified - PPO | Admitting: Psychology

## 2020-03-25 DIAGNOSIS — F4323 Adjustment disorder with mixed anxiety and depressed mood: Secondary | ICD-10-CM | POA: Diagnosis not present

## 2020-03-26 ENCOUNTER — Ambulatory Visit (INDEPENDENT_AMBULATORY_CARE_PROVIDER_SITE_OTHER): Payer: Federal, State, Local not specified - PPO | Admitting: Family Medicine

## 2020-03-26 ENCOUNTER — Other Ambulatory Visit: Payer: Self-pay

## 2020-03-26 ENCOUNTER — Encounter: Payer: Self-pay | Admitting: Family Medicine

## 2020-03-26 VITALS — BP 116/72 | HR 94 | Ht 66.5 in | Wt 126.0 lb

## 2020-03-26 DIAGNOSIS — M940 Chondrocostal junction syndrome [Tietze]: Secondary | ICD-10-CM | POA: Diagnosis not present

## 2020-03-26 DIAGNOSIS — M999 Biomechanical lesion, unspecified: Secondary | ICD-10-CM | POA: Diagnosis not present

## 2020-03-26 NOTE — Progress Notes (Signed)
[place] Phone: [phone] Subjective:   Allison [name], am serving as a scribe for Dr. Hulan Saas. This visit occurred during the SARS-CoV-2 public health emergency.  Safety protocols were in place, including screening questions prior to the visit, additional usage of staff PPE, and extensive cleaning of exam room while observing appropriate contact time as indicated for disinfecting solutions.   I'm seeing this patient by the request  of:  Inda Coke, [place]  CC: Neck and back  pain follow-up  [id number]  Allison [name] is a 34 y.o. female coming in with complaint of SI joint pain. Last seen on 3/22/201 for OMT. Patient states overall doing relatively well.  Mild discomfort from time to time but nothing severe.  Patient states that she has been in a car more frequently recently and it seems that this is aggravating it.  Patient may be in the process of moving in the near future    Past Medical History:  Diagnosis Date  . Chicken pox   . Heart murmur   . History of lumbar compression fracture 06/27/2018   Compression fracture L2-L3 Dexa done 07/05/17 report in chart CT lumbar report from 04/22/17 in chart as well as xray from 04/19/17  . Lactose intolerance 07/11/2018   Age 79  . Osteoporosis, followed by Dr. Buddy Duty, thought to be due to Accutane 07/27/2017   Done density done 07/05/18 report in chart.   . Psoriasis 07/11/2018  . 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 KNEE ARTHROSCOPY Right   . WISDOM TOOTH EXTRACTION  2005   Social History   Socioeconomic History  . Marital status: Married    Spouse name: Not on file  . Number of children: 0  . Years of education: Not on file  . Highest education level: Not on file  Occupational  History  . Occupation: Human resources officer: [employer]  Tobacco Use  . Smoking status: Never Smoker  . Smokeless tobacco: Never Used  Substance and Sexual Activity  . Alcohol use: Yes    Alcohol/week: 2.0 standard drinks    Types: 2 Glasses of wine per week    Comment: twice a month  . Drug use: Never  . Sexual activity: Yes    Partners: Male    Birth control/protection: Rhythm, Condom  Other Topics Concern  . 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 Difficulty of Paying Living Expenses:   Food Insecurity:   . Worried About Charity fundraiser in the Last Year:   . Arboriculturist in the Last Year:   Transportation Needs:   . Film/video editor (Medical):   Marland Kitchen Lack of Transportation (Non-Medical):   Physical Activity:   . Days of Exercise per Week:   . Minutes of Exercise per Session:   Stress:   . Feeling of Stress :   Social Connections:   . Frequency of Communication with Friends and Family:   . Frequency of Social Gatherings with Friends and Family:   . Attends Religious Services:   . Active Member of Clubs or Organizations:   .  Attends Archivist Meetings:   Marland Kitchen Marital Status:    Allergies  Allergen Reactions  . Morphine Rash    Age 89 said felt like skin burning tolerated hydrocodone in past    Family History  Problem Relation Age of Onset  . Depression Mother   . Miscarriages / [place] Mother   . High Cholesterol Father   . Early death Maternal Grandmother        Brain aneurysm   . Cancer Maternal Grandfather   . COPD Paternal Grandmother   . Heart attack Paternal Grandmother         Current Outpatient Medications (Hematological):  Marland Kitchen  Polysaccharide Iron Complex (NU-IRON PO), Take 1 tablet by mouth daily.   Current Outpatient Medications (Other):  Marland Kitchen  Calcium  Carb-Cholecalciferol (CALCIUM-VITAMIN D) 500-200 MG-UNIT tablet, Take by mouth. .  cholecalciferol (VITAMIN D) 1000 units tablet, Take 2,000 Units by mouth daily. .  cyclobenzaprine (FLEXERIL) 5 MG tablet, Take 1 tablet (5 mg total) by mouth 3 (three) times daily as needed for muscle spasms. .  Prenatal Vit-Fe Fumarate-FA (PRENATAL MULTIVITAMIN) TABS tablet, Take 1 tablet by mouth daily at 12 noon.   Reviewed prior external information including notes and imaging from  primary care provider As well as notes that were available from care everywhere and other healthcare systems.  Past medical history, social, surgical and family history all reviewed in electronic medical record.  [name] unless stated regarding to the chief complaint.   Review of Systems:  [name] headache, visual changes, nausea, vomiting, diarrhea, constipation, dizziness, abdominal pain, skin rash, fevers, chills, night sweats, weight loss, swollen lymph nodes, body aches, joint swelling, chest pain, shortness of breath, mood changes. POSITIVE muscle aches  Objective  Blood pressure 116/72, pulse 94, height 5' 6.5" (1.689 m), weight 126 lb (57.2 kg), SpO2 99 %.   General: [name] apparent distress alert and oriented x3 mood and affect normal, dressed appropriately.  HEENT: Pupils equal, extraocular movements intact  Respiratory: Patient's speak in full sentences and does not appear short of breath  Cardiovascular: [name] lower extremity edema, non tender, [name] erythema  Neuro: Cranial nerves II through XII are intact, neurovascularly intact in all extremities with 2+ DTRs and 2+ pulses.  Gait normal with good balance and coordination.  MSK:  Non tender with full range of motion and good stability and symmetric strength and tone of shoulders, elbows, wrist, hip, knee and ankles bilaterally.  Neck exam does have some mild loss of lordosis.  Tightness noted in the parascapular region right greater than left.  Patient does  have tenderness in the sacroiliac joint bilaterally  Osteopathic findings  C2 flexed rotated and side bent right C6 flexed rotated and side bent left T3 extended rotated and side bent right inhaled third rib T7 extended rotated and side bent left     Impression and Recommendations:      The above documentation has been reviewed and is accurate and complete Lyndal Pulley, DO       Note: This dictation was prepared with Dragon dictation along with smaller phrase technology. Any transcriptional errors that result from this process are unintentional.

## 2020-03-26 NOTE — Assessment & Plan Note (Signed)
   Decision today to treat with OMT was based on Physical Exam  After verbal consent patient was treated with HVLA, ME, FPR techniques in cervical, thoracic, rib,areas, all areas are chronic   Patient tolerated the procedure well with improvement in symptoms  Patient given exercises, stretches and lifestyle modifications  See medications in patient instructions if given  Patient will follow up in 4-8 weeks 

## 2020-03-26 NOTE — Patient Instructions (Signed)
Doing great See me again in 6-8 weeks

## 2020-03-26 NOTE — Assessment & Plan Note (Signed)
Chronic problem is actually doing relatively well.  Patient seems to be stable.  Taking vitamin D.  Seem to make significant difference with patient taking the different diet changes.  Patient will continue to be active.  Follow-up with me again in 6 to 8 weeks.

## 2020-04-12 DIAGNOSIS — M549 Dorsalgia, unspecified: Secondary | ICD-10-CM | POA: Diagnosis not present

## 2020-04-12 DIAGNOSIS — M999 Biomechanical lesion, unspecified: Secondary | ICD-10-CM | POA: Diagnosis not present

## 2020-04-12 DIAGNOSIS — M6281 Muscle weakness (generalized): Secondary | ICD-10-CM | POA: Diagnosis not present

## 2020-04-29 ENCOUNTER — Ambulatory Visit: Payer: Federal, State, Local not specified - PPO | Admitting: Psychology

## 2020-04-29 ENCOUNTER — Ambulatory Visit (INDEPENDENT_AMBULATORY_CARE_PROVIDER_SITE_OTHER): Payer: Federal, State, Local not specified - PPO | Admitting: Family Medicine

## 2020-04-29 ENCOUNTER — Encounter: Payer: Self-pay | Admitting: Family Medicine

## 2020-04-29 ENCOUNTER — Other Ambulatory Visit: Payer: Self-pay

## 2020-04-29 VITALS — BP 100/70 | HR 76 | Ht 66.0 in | Wt 126.0 lb

## 2020-04-29 DIAGNOSIS — M533 Sacrococcygeal disorders, not elsewhere classified: Secondary | ICD-10-CM

## 2020-04-29 DIAGNOSIS — M999 Biomechanical lesion, unspecified: Secondary | ICD-10-CM | POA: Diagnosis not present

## 2020-04-29 NOTE — Progress Notes (Signed)
Allison [name] [address] [place] Phone: [phone] Subjective:   I Allison [name] am serving as a Education administrator for Dr. Hulan Saas.  This visit occurred during the SARS-CoV-2 public health emergency.  Safety protocols were in place, including screening questions prior to the visit, additional usage of staff PPE, and extensive cleaning of exam room while observing appropriate contact time as indicated for disinfecting solutions.   I'm seeing this patient by the request  of:  Allison [name], [place]  CC: Back and neck pain follow-up  [id number]  Allison [name] is a 34 y.o. female coming in with complaint of back pain. Last seen 03/26/2020 for OMT. Patient states her upper back is most painful recently. 2 weeks ago is when the flare started.  Patient is having some mild stress trying to figure out what she is going to do with her career at this time.    Past Medical History:  Diagnosis Date  . Chicken pox   . Heart murmur   . History of lumbar compression fracture 06/27/2018   Compression fracture L2-L3 Dexa done 07/05/17 report in chart CT lumbar report from 04/22/17 in chart as well as xray from 04/19/17  . Lactose intolerance 07/11/2018   Age 34  . Osteoporosis, followed by Dr. Buddy Duty, thought to be due to Accutane 07/27/2017   Done density done 07/05/18 report in chart.   . Psoriasis 07/11/2018  . 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 KNEE ARTHROSCOPY Right   . WISDOM TOOTH EXTRACTION  2005   Social History   Socioeconomic History  . Marital status: Married    Spouse name: Not on file  . Number of children: 0  . Years of education: Not on file  . Highest education level: Not on file  Occupational History  . Occupation: Human resources officer: [employer]  Tobacco Use  . Smoking status: Never Smoker  . Smokeless tobacco: Never Used  Vaping Use  . Vaping Use: Never used  Substance and Sexual Activity  . Alcohol use: Yes    Alcohol/week: 2.0 standard drinks    Types: 2 Glasses of wine per week    Comment: twice a month  . Drug use: Never  . Sexual activity: Yes    Partners: Male    Birth control/protection: Rhythm, Condom  Other Topics Concern  . 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 Difficulty of Paying Living Expenses:   Food Insecurity:   . Worried About Charity fundraiser in the Last Year:   . Arboriculturist in the Last Year:   Transportation Needs:   . Film/video editor (Medical):   Marland Kitchen Lack of Transportation (Non-Medical):   Physical Activity:   . Days of Exercise per Week:   . Minutes of Exercise per Session:   Stress:   . Feeling of Stress :   Social Connections:   . Frequency of Communication with Friends and Family:   . Frequency of Social Gatherings with Friends and Family:   . Attends Religious Services:   . Active Member of Clubs or Organizations:   . Attends  Club or Organization Meetings:   Marland Kitchen Marital Status:    Allergies  Allergen Reactions  . Morphine Rash    Age 41 said felt like skin burning tolerated hydrocodone in past    Family History  Problem Relation Age of Onset  . Depression Mother   . Miscarriages / [place] Mother   . High Cholesterol Father   . Early death Maternal Grandmother        Brain aneurysm   . Cancer Maternal Grandfather   . COPD Paternal Grandmother   . Heart attack Paternal Grandmother         Current Outpatient Medications (Hematological):  Marland Kitchen  Polysaccharide Iron Complex (NU-IRON PO), Take 1 tablet by mouth daily.   Current Outpatient Medications (Other):  Marland Kitchen  Calcium Carb-Cholecalciferol (CALCIUM-VITAMIN  D) 500-200 MG-UNIT tablet, Take by mouth. .  cholecalciferol (VITAMIN D) 1000 units tablet, Take 2,000 Units by mouth daily. .  cyclobenzaprine (FLEXERIL) 5 MG tablet, Take 1 tablet (5 mg total) by mouth 3 (three) times daily as needed for muscle spasms. .  Prenatal Vit-Fe Fumarate-FA (PRENATAL MULTIVITAMIN) TABS tablet, Take 1 tablet by mouth daily at 12 noon.   Reviewed prior external information including notes and imaging from  primary care provider As well as notes that were available from care everywhere and other healthcare systems.  Past medical history, social, surgical and family history all reviewed in electronic medical record.  No pertanent information unless stated regarding to the chief complaint.   Review of Systems:  No headache, visual changes, nausea, vomiting, diarrhea, constipation, dizziness, abdominal pain, skin rash, fevers, chills, night sweats, weight loss, swollen lymph nodes, , joint swelling, chest pain, shortness of breath, mood changes. POSITIVE muscle aches mild body aches  Objective  Blood pressure 100/70, pulse 76, height 5\' 6"  (1.676 m), weight 126 lb (57.2 kg), SpO2 98 %.   General: No apparent distress alert and oriented x3 mood and affect normal, dressed appropriately.  HEENT: Pupils equal, extraocular movements intact  Respiratory: Patient's speak in full sentences and does not appear short of breath  Cardiovascular: No lower extremity edema, non tender, no erythema  Neuro: Cranial nerves II through XII are intact, neurovascularly intact in all extremities with 2+ DTRs and 2+ pulses.  Gait normal with good balance and coordination.  MSK:   Tightness noted in the parascapular region right greater than left.  Mild pain with side bending of the neck bilaterally.  Lower back does have some mild tightness with FABER test.  5-5 strength of all  Osteopathic findings  C2 flexed rotated and side bent right C6 flexed rotated and side bent left T3 extended  rotated and side bent left inhaled third rib T5 extended rotated and side bent left L2 flexed rotated and side bent right Sacrum right on right     Impression and Recommendations:     The above documentation has been reviewed and is accurate and complete Lyndal Pulley, DO       Note: This dictation was prepared with Dragon dictation along with smaller phrase technology. Any transcriptional errors that result from this process are unintentional.

## 2020-04-30 ENCOUNTER — Encounter: Payer: Self-pay | Admitting: Family Medicine

## 2020-04-30 NOTE — Assessment & Plan Note (Signed)

## 2020-04-30 NOTE — Assessment & Plan Note (Signed)
Chronic problem with mild exacerbation.  Discussed multiple Flexeril.  Discussed anti-inflammatories if needed.  Patient has been doing very well but will call for diet control.  Patient will continue to do the exercises.  Continue to work and Personal assistant.  Follow-up again in 4 to 8 weeks

## 2020-05-08 ENCOUNTER — Ambulatory Visit: Payer: Federal, State, Local not specified - PPO | Admitting: Family Medicine

## 2020-05-13 ENCOUNTER — Ambulatory Visit: Payer: Federal, State, Local not specified - PPO | Admitting: Family Medicine

## 2020-05-13 ENCOUNTER — Encounter: Payer: Self-pay | Admitting: Family Medicine

## 2020-05-13 ENCOUNTER — Other Ambulatory Visit: Payer: Self-pay

## 2020-05-13 VITALS — BP 100/63 | HR 73 | Ht 66.0 in | Wt 125.0 lb

## 2020-05-13 DIAGNOSIS — M549 Dorsalgia, unspecified: Secondary | ICD-10-CM | POA: Diagnosis not present

## 2020-05-13 DIAGNOSIS — M533 Sacrococcygeal disorders, not elsewhere classified: Secondary | ICD-10-CM | POA: Diagnosis not present

## 2020-05-13 DIAGNOSIS — M999 Biomechanical lesion, unspecified: Secondary | ICD-10-CM | POA: Diagnosis not present

## 2020-05-13 DIAGNOSIS — M6281 Muscle weakness (generalized): Secondary | ICD-10-CM | POA: Diagnosis not present

## 2020-05-13 DIAGNOSIS — M5384 Other specified dorsopathies, thoracic region: Secondary | ICD-10-CM | POA: Diagnosis not present

## 2020-05-13 NOTE — Assessment & Plan Note (Addendum)
   Decision today to treat with OMT was based on Physical Exam  After verbal consent patient was treated with HVLA, ME, FPR techniques in cervical, thoracic, pelvic, rib lumbar and sacral areas, all areas are chronic   Patient tolerated the procedure well with improvement in symptoms  Patient given exercises, stretches and lifestyle modifications  See medications in patient instructions if given  Patient will follow up in 4-8 weeks

## 2020-05-13 NOTE — Patient Instructions (Addendum)
See me again in 6 weeks Keeping fingers crossed for the job

## 2020-05-13 NOTE — Assessment & Plan Note (Signed)
Stable at the moment.  Doing much better overall.  Discussed with patient on icing regimen and home exercise, which activities to do which wants to avoid.  Increase activity slowly.  Patient is much better than previous exam.  Follow-up again in 4 to 8 weeks

## 2020-05-13 NOTE — Progress Notes (Signed)
[place] [address] [place] Phone: [phone] Subjective:   I Allison [name] am serving as a Education administrator for Dr. Hulan Saas.  This visit occurred during the SARS-CoV-2 public health emergency.  Safety protocols were in place, including screening questions prior to the visit, additional usage of staff PPE, and extensive cleaning of exam room while observing appropriate contact time as indicated for disinfecting solutions.   I'm seeing this patient by the request  of:  Allison [name], [place]  CC: Back pain follow-up  [id number]  Allison [name] is a 34 y.o. female coming in with complaint of back and neck pain. OMT 04/29/2020. Patient states she is better than she was 2 weeks ago. Going to PT at [facility] and would like to continue.   Medications patient has been prescribed: Vitamin D, intermittent muscle relaxer           Reviewed prior external information including notes and imaging from previsou exam, outside providers and external EMR if available.   As well as notes that were available from care everywhere and other healthcare systems.  Past medical history, social, surgical and family history all reviewed in electronic medical record.  No pertanent information unless stated regarding to the chief complaint.   Past Medical History:  Diagnosis Date  . Chicken pox   . Heart murmur   . History of lumbar compression fracture 06/27/2018   Compression fracture L2-L3 Dexa done 07/05/17 report in chart CT lumbar report from 04/22/17 in chart as well as xray from 04/19/17  . Lactose intolerance 07/11/2018   Age 89  . Osteoporosis, followed by Dr. Buddy Duty, thought to be due to Accutane 07/27/2017   Done density done 07/05/18 report in chart.   . Psoriasis 07/11/2018  . 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 Morphine Rash    Age 89 said felt like skin burning tolerated hydrocodone in past      Review of Systems:  No headache, visual changes, nausea, vomiting, diarrhea, constipation, dizziness, abdominal pain, skin rash, fevers, chills, night sweats, weight loss, swollen lymph nodes, body aches, joint swelling, chest pain, shortness of breath, mood changes. POSITIVE muscle aches  Objective  Blood pressure 100/63, pulse 73, height 5\' 6"  (1.676 m), weight 125 lb (56.7 kg), SpO2 99 %.   General: No apparent distress alert and oriented x3 mood and affect normal, dressed appropriately.  HEENT: Pupils equal, extraocular movements intact  Respiratory: Patient's speak in full sentences and does not appear short of breath  Cardiovascular: No lower extremity edema, non tender, no erythema  Neuro: Cranial nerves II through XII are intact, neurovascularly intact in all extremities with 2+ DTRs and 2+ pulses.  Gait normal with good balance and coordination.  MSK:  Non tender with full range of motion and good stability and symmetric strength and tone of shoulders, elbows, wrist, hip, knee and ankles bilaterally.  Back -   Osteopathic findings  C2 flexed rotated and side bent right C6 flexed rotated and side bent left T3 extended rotated and side bent right inhaled rib T7 extended rotated and side bent left L2 flexed rotated and side bent right Sacrum right on right Pelvic shear noted       Assessment and Plan:  SI (sacroiliac) joint dysfunction Stable at the moment.  Doing much better overall.  Discussed with patient on icing regimen and home exercise, which activities to do which wants to avoid.  Increase activity slowly.  Patient is much better than previous exam.  Follow-up again in 4 to 8 weeks  Nonallopathic lesion of cervical region   Decision today to treat with OMT was based on Physical Exam  After verbal consent patient was treated with HVLA, ME, FPR  techniques in cervical, thoracic, pelvic, rib lumbar and sacral areas, all areas are chronic   Patient tolerated the procedure well with improvement in symptoms  Patient given exercises, stretches and lifestyle modifications  See medications in patient instructions if given  Patient will follow up in 4-8 weeks        The above documentation has been reviewed and is accurate and complete Allison Pulley, DO       Note: This dictation was prepared with Dragon dictation along with smaller phrase technology. Any transcriptional errors that result from this process are unintentional.

## 2020-05-27 ENCOUNTER — Ambulatory Visit (INDEPENDENT_AMBULATORY_CARE_PROVIDER_SITE_OTHER): Payer: Federal, State, Local not specified - PPO | Admitting: Psychology

## 2020-05-27 DIAGNOSIS — F4323 Adjustment disorder with mixed anxiety and depressed mood: Secondary | ICD-10-CM | POA: Diagnosis not present

## 2020-06-19 ENCOUNTER — Ambulatory Visit (INDEPENDENT_AMBULATORY_CARE_PROVIDER_SITE_OTHER): Payer: Federal, State, Local not specified - PPO | Admitting: Psychology

## 2020-06-19 DIAGNOSIS — F4323 Adjustment disorder with mixed anxiety and depressed mood: Secondary | ICD-10-CM | POA: Diagnosis not present

## 2020-06-24 ENCOUNTER — Encounter: Payer: Self-pay | Admitting: Family Medicine

## 2020-06-24 ENCOUNTER — Other Ambulatory Visit: Payer: Self-pay

## 2020-06-24 ENCOUNTER — Ambulatory Visit: Payer: Federal, State, Local not specified - PPO | Admitting: Family Medicine

## 2020-06-24 VITALS — BP 102/68 | HR 89 | Ht 66.0 in | Wt 124.0 lb

## 2020-06-24 DIAGNOSIS — M999 Biomechanical lesion, unspecified: Secondary | ICD-10-CM

## 2020-06-24 DIAGNOSIS — M940 Chondrocostal junction syndrome [Tietze]: Secondary | ICD-10-CM

## 2020-06-24 NOTE — Progress Notes (Signed)
[place] [facility] [place] Phone: [phone] Subjective:   [name], am serving as a scribe for Dr. Hulan Saas. This visit occurred during the SARS-CoV-2 public health emergency.  Safety protocols were in place, including screening questions prior to the visit, additional usage of staff PPE, and extensive cleaning of exam room while observing appropriate contact time as indicated for disinfecting solutions.   I'm seeing this patient by the request  of:  Inda Coke, [place]  CC: Upper back pain follow-up  [id number]  Merida Alcantar is a 34 y.o. female coming in with complaint of back and neck pain. OMT 05/13/2020. Patient states that she saw her massage therapist today. Tightness in lower back and glutes.  Patient is in the process of likely moving here in the relatively near future.  Patient is not been doing anything else new at the moment.  May be some very mild increase in stress but nothing severe.  Medications patient has been prescribed: Cyclobenzaprine  Taking: Intermittently         Reviewed prior external information including notes and imaging from previsou exam, outside providers and external EMR if available.   As well as notes that were available from care everywhere and other healthcare systems.  Past medical history, social, surgical and family history all reviewed in electronic medical record.  No pertanent information unless stated regarding to the chief complaint.   Past Medical History:  Diagnosis Date  . Chicken pox   . Heart murmur   . History of lumbar compression fracture 06/27/2018   Compression fracture L2-L3 Dexa done 07/05/17 report in chart CT lumbar report from 04/22/17 in chart as well as xray from 04/19/17  . Lactose intolerance 07/11/2018   Age 23  . Osteoporosis, followed by Dr. Buddy Duty, thought to be due to Accutane 07/27/2017   Done density done 07/05/18 report in chart.   .  Psoriasis 07/11/2018  . 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 Morphine Rash    Age 23 said felt like skin burning tolerated hydrocodone in past      Review of Systems:  No headache, visual changes, nausea, vomiting, diarrhea, constipation, dizziness, abdominal pain, skin rash, fevers, chills, night sweats, weight loss, swollen lymph nodes, body aches, joint swelling, chest pain, shortness of breath, mood changes. POSITIVE muscle aches  Objective  There were no vitals taken for this visit.   General: No apparent distress alert and oriented x3 mood and affect normal, dressed appropriately.  HEENT: Pupils equal, extraocular movements intact  Respiratory: Patient's speak in full sentences and does not appear short of breath  Cardiovascular: No lower extremity edema, non tender, no erythema  Neuro: Cranial nerves II through XII are intact, neurovascularly intact in all extremities with 2+ DTRs and 2+ pulses.  Gait normal with good balance and coordination.  MSK:  Non tender with full range of motion and good stability and symmetric strength and tone of shoulders, elbows, wrist, hip, knee and ankles bilaterally.  Back -low back exam does show some tightness noted in the paraspinal musculature lumbar more in the thoracolumbar juncture and some over the sacroiliac joint.  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  Continues to have tightness on the right side.  Chronic problem with exacerbation.  Discussed which activities to do which wants to avoid.  Increase  activity slowly.  Continue to work on Personal assistant.  Patient will increase activity slowly.  Still believe most of patient's even arm pain is coming from more of the neck in the upper back follow-up in 5 to 6 weeks.    Nonallopathic problems  Decision today to treat with OMT was based on Physical Exam  After verbal consent patient was treated with HVLA, ME, FPR techniques in cervical, rib, thoracic, lumbar, and sacral  areas  Patient tolerated the procedure well with improvement in symptoms  Patient given exercises, stretches and lifestyle modifications  See medications in patient instructions if given  Patient will follow up in 4-8 weeks      The above documentation has been reviewed and is accurate and complete Lyndal Pulley, DO       Note: This dictation was prepared with Dragon dictation along with smaller phrase technology. Any transcriptional errors that result from this process are unintentional.

## 2020-06-24 NOTE — Assessment & Plan Note (Signed)
Patient does have more of a supra syndrome.  Continues to have tightness on the right side.  Chronic problem with exacerbation.  Discussed which activities to do which wants to avoid.  Increase activity slowly.  Continue to work on Personal assistant.  Patient will increase activity slowly.  Still believe most of patient's even arm pain is coming from more of the neck in the upper back follow-up in 5 to 6 weeks.

## 2020-06-24 NOTE — Patient Instructions (Signed)
See me again in  

## 2020-07-08 DIAGNOSIS — M6281 Muscle weakness (generalized): Secondary | ICD-10-CM | POA: Diagnosis not present

## 2020-07-08 DIAGNOSIS — M999 Biomechanical lesion, unspecified: Secondary | ICD-10-CM | POA: Diagnosis not present

## 2020-07-08 DIAGNOSIS — M5384 Other specified dorsopathies, thoracic region: Secondary | ICD-10-CM | POA: Diagnosis not present

## 2020-07-08 DIAGNOSIS — M549 Dorsalgia, unspecified: Secondary | ICD-10-CM | POA: Diagnosis not present

## 2020-07-15 ENCOUNTER — Ambulatory Visit (INDEPENDENT_AMBULATORY_CARE_PROVIDER_SITE_OTHER): Payer: Federal, State, Local not specified - PPO | Admitting: Psychology

## 2020-07-15 DIAGNOSIS — F4323 Adjustment disorder with mixed anxiety and depressed mood: Secondary | ICD-10-CM

## 2020-07-15 DIAGNOSIS — M6281 Muscle weakness (generalized): Secondary | ICD-10-CM | POA: Diagnosis not present

## 2020-07-15 DIAGNOSIS — M549 Dorsalgia, unspecified: Secondary | ICD-10-CM | POA: Diagnosis not present

## 2020-07-15 DIAGNOSIS — M999 Biomechanical lesion, unspecified: Secondary | ICD-10-CM | POA: Diagnosis not present

## 2020-07-15 DIAGNOSIS — M5384 Other specified dorsopathies, thoracic region: Secondary | ICD-10-CM | POA: Diagnosis not present

## 2020-07-26 NOTE — Progress Notes (Signed)
[place] [address] [place] Phone: [phone] Subjective:   I [name] am serving as a Education administrator for Dr. Hulan Saas.  This visit occurred during the SARS-CoV-2 public health emergency.  Safety protocols were in place, including screening questions prior to the visit, additional usage of staff PPE, and extensive cleaning of exam room while observing appropriate contact time as indicated for disinfecting solutions.   I'm seeing this patient by the request  of:  Inda Coke, [place]  CC: Neck and back pain follow-up  [id number]  Xitlali Kastens is a 34 y.o. female coming in with complaint of back and neck pain. OMT 06/24/2020. Patient states she is doing well. PT yesterday and had dry needling.  Overall doing well, some mild increase in stress with patient still complaining her career out at this time and is likely going to be moving to the [facility] area deciding on jobs at this time.  Medications patient has been prescribed: None  Taking:         Reviewed prior external information including notes and imaging from previsou exam, outside providers and external EMR if available.   As well as notes that were available from care everywhere and other healthcare systems.  Past medical history, social, surgical and family history all reviewed in electronic medical record.  No pertanent information unless stated regarding to the chief complaint.   Past Medical History:  Diagnosis Date   Chicken pox    Heart murmur    History of lumbar compression fracture 06/27/2018   Compression fracture L2-L3 Dexa done 07/05/17 report in chart CT lumbar report from 04/22/17 in chart as well as xray from 04/19/17   Lactose intolerance 07/11/2018   Age 65   Osteoporosis, followed by Dr. Buddy Duty, thought to be due to Accutane 07/27/2017   Done density done 07/05/18 report in chart.    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 POSITIVE muscle aches, body aches  Objective  Blood pressure 100/70, pulse 89, height 5\' 6"  (1.676 m), weight 123 lb (55.8 kg), SpO2 99 %.   General: No apparent distress alert and oriented x3 mood and affect normal, dressed appropriately.  HEENT: Pupils equal, extraocular movements intact  Respiratory: Patient's speak in full sentences and does not appear short of breath  Cardiovascular: No lower extremity edema, non tender, no erythema  Neuro: Cranial nerves II through XII are intact, neurovascularly intact in all extremities with 2+ DTRs and 2+ pulses.  Gait normal with good balance and coordination.  MSK:  Non tender with full range of motion and good stability and symmetric strength and tone of shoulders, elbows, wrist, hip, knee and ankles bilaterally.  Back -tightness noted in the parascapular region right greater than left.  Very mild scapular dyskinesis noted.  Tightness of the right sacroiliac joint also noted today.  Very minimal tightness of the hip flexors bilaterally.  Osteopathic findings   C6 flexed rotated and side bent left T3 extended rotated  and side bent right inhaled rib T9 extended rotated and side bent left L4 flexed rotated and side bent right Sacrum right on right       Assessment and Plan:  Slipped rib syndrome More of a tightness in the upper scapular region.  Patient did respond well to the manipulation.  Discussed icing regimen, home exercise, avoiding  certain activities.  Discussed ergonomics at work.  He does have Flexeril for breakthrough, follow-up with me again in 5 to 6 weeks    Nonallopathic problems  Decision today to treat with OMT was based on Physical Exam  After verbal consent patient was treated with HVLA, ME, FPR techniques in cervical, rib, thoracic, lumbar, and sacral  areas  Patient tolerated the procedure well with improvement in symptoms   Patient given exercises, stretches and lifestyle modifications  See medications in patient instructions if given  Patient will follow up in 4-8 weeks      The above documentation has been reviewed and is accurate and complete Lyndal Pulley, DO       Note: This dictation was prepared with Dragon dictation along with smaller phrase technology. Any transcriptional errors that result from this process are unintentional.

## 2020-07-29 DIAGNOSIS — M549 Dorsalgia, unspecified: Secondary | ICD-10-CM | POA: Diagnosis not present

## 2020-07-29 DIAGNOSIS — M6281 Muscle weakness (generalized): Secondary | ICD-10-CM | POA: Diagnosis not present

## 2020-07-29 DIAGNOSIS — M5384 Other specified dorsopathies, thoracic region: Secondary | ICD-10-CM | POA: Diagnosis not present

## 2020-07-30 ENCOUNTER — Ambulatory Visit: Payer: Federal, State, Local not specified - PPO | Admitting: Family Medicine

## 2020-07-30 ENCOUNTER — Other Ambulatory Visit: Payer: Self-pay

## 2020-07-30 ENCOUNTER — Encounter: Payer: Self-pay | Admitting: Family Medicine

## 2020-07-30 VITALS — BP 100/70 | HR 89 | Ht 66.0 in | Wt 123.0 lb

## 2020-07-30 DIAGNOSIS — M999 Biomechanical lesion, unspecified: Secondary | ICD-10-CM | POA: Diagnosis not present

## 2020-07-30 DIAGNOSIS — M940 Chondrocostal junction syndrome [Tietze]: Secondary | ICD-10-CM

## 2020-07-30 NOTE — Patient Instructions (Signed)
Good to see you Good luck with the housing decision See me again in 5-6 weeks

## 2020-07-30 NOTE — Assessment & Plan Note (Signed)
More of a tightness in the upper scapular region.  Patient did respond well to the manipulation.  Discussed icing regimen, home exercise, avoiding certain activities.  Discussed ergonomics at work.  He does have Flexeril for breakthrough, follow-up with me again in 5 to 6 weeks

## 2020-08-26 ENCOUNTER — Ambulatory Visit (INDEPENDENT_AMBULATORY_CARE_PROVIDER_SITE_OTHER): Payer: Federal, State, Local not specified - PPO | Admitting: Psychology

## 2020-08-26 DIAGNOSIS — F4323 Adjustment disorder with mixed anxiety and depressed mood: Secondary | ICD-10-CM

## 2020-09-02 ENCOUNTER — Encounter: Payer: Self-pay | Admitting: Family Medicine

## 2020-09-02 ENCOUNTER — Other Ambulatory Visit: Payer: Self-pay

## 2020-09-02 ENCOUNTER — Ambulatory Visit: Payer: Federal, State, Local not specified - PPO | Admitting: Family Medicine

## 2020-09-02 VITALS — BP 110/80 | HR 86 | Ht 66.0 in | Wt 126.0 lb

## 2020-09-02 DIAGNOSIS — M999 Biomechanical lesion, unspecified: Secondary | ICD-10-CM | POA: Diagnosis not present

## 2020-09-02 DIAGNOSIS — M533 Sacrococcygeal disorders, not elsewhere classified: Secondary | ICD-10-CM | POA: Diagnosis not present

## 2020-09-02 NOTE — Patient Instructions (Signed)
So go you are doing well See me in 6 weeks

## 2020-09-02 NOTE — Progress Notes (Signed)
[place] [address] [place] Phone: [phone] Subjective:   I Allison [name] am serving as a Education administrator for Dr. Hulan Saas.  This visit occurred during the SARS-CoV-2 public health emergency.  Safety protocols were in place, including screening questions prior to the visit, additional usage of staff PPE, and extensive cleaning of exam room while observing appropriate contact time as indicated for disinfecting solutions.   I'm seeing this patient by the request  of:  Inda Coke, [place]  CC: Neck and back pain follow-up  [id number]     Allison [name] is a 34 y.o. female coming in with complaint of back and neck pain. OMT 07/30/2020. Patient states she just moved. Left sided hip and piriformis pain.  Patient has been moving boxes more.  And traveling in a car more frequently as well.  Noticing some increasing tightness.  Medications patient has been prescribed: Flexeril  Taking: Intermittently         Reviewed prior external information including notes and imaging from previsou exam, outside providers and external EMR if available.   As well as notes that were available from care everywhere and other healthcare systems.  Past medical history, social, surgical and family history all reviewed in electronic medical record.  No pertanent information unless stated regarding to the chief complaint.   Past Medical History:  Diagnosis Date  . Chicken pox   . Heart murmur   . History of lumbar compression fracture 06/27/2018   Compression fracture L2-L3 Dexa done 07/05/17 report in chart CT lumbar report from 04/22/17 in chart as well as xray from 04/19/17  . Lactose intolerance 07/11/2018   Age 87  . Osteoporosis, followed by Dr. Buddy Duty, thought to be due to Accutane 07/27/2017   Done density done 07/05/18 report in chart.   . Psoriasis 07/11/2018  . SI (sacroiliac) joint dysfunction 07/12/2017   Right-sided injection 08/09/2017  Left-sided injected September 27, 2017  . 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 Morphine Rash    Age 87 said felt like skin burning tolerated hydrocodone in past      Review of Systems:  No headache, visual changes, nausea, vomiting, diarrhea, constipation, dizziness, abdominal pain, skin rash, fevers, chills, night sweats, weight loss, swollen lymph nodes, body aches, joint swelling, chest pain, shortness of breath, mood changes. POSITIVE muscle aches  Objective  Blood pressure 110/80, pulse 86, height 5\' 6"  (1.676 m), weight 126 lb (57.2 kg), SpO2 99 %.   General: No apparent distress alert and oriented x3 mood and affect normal, dressed appropriately.  HEENT: Pupils equal, extraocular movements intact  Respiratory: Patient's speak in full sentences and does not appear short of breath  Cardiovascular: No lower extremity edema, non tender, no erythema  Neuro: Cranial nerves II through XII are intact, neurovascularly intact in all extremities with 2+ DTRs and 2+ pulses.  Gait normal with good balance and coordination.  MSK:  Non tender with full range of motion and good stability and symmetric strength and tone of shoulders, elbows, wrist, hip, knee and ankles bilaterally.  Back -back exam does have some mild loss of lordosis, tightness noted in the paraspinal musculature of the lumbar spine likely left.  Patient does have some mild tightness in the scapular region right greater than left as well.  Osteopathic findings  C2 flexed rotated and side bent right C6 flexed rotated and side bent left T3 extended rotated and side bent right  inhaled rib T9 extended rotated and side bent left L1 flexed rotated and side bent right Sacrum left on left       Assessment and Plan:  SI (sacroiliac) joint dysfunction Patient has been doing more lifting.  Having a mild piriformis on the left side.  Discussed posture and ergonomics.  Patient is going to continue to  use the Flexeril as needed.  Follow-up with me again in 4 to 8 weeks.    Nonallopathic problems  Decision today to treat with OMT was based on Physical Exam  After verbal consent patient was treated with HVLA, ME, FPR techniques in cervical, rib, thoracic, lumbar, and sacral  areas  Patient tolerated the procedure well with improvement in symptoms  Patient given exercises, stretches and lifestyle modifications  See medications in patient instructions if given  Patient will follow up in 4-8 weeks      The above documentation has been reviewed and is accurate and complete Lyndal Pulley, DO       Note: This dictation was prepared with Dragon dictation along with smaller phrase technology. Any transcriptional errors that result from this process are unintentional.

## 2020-09-02 NOTE — Assessment & Plan Note (Signed)
Patient has been doing more lifting.  Having a mild piriformis on the left side.  Discussed posture and ergonomics.  Patient is going to continue to use the Flexeril as needed.  Follow-up with me again in 4 to 8 weeks.

## 2020-09-09 ENCOUNTER — Ambulatory Visit (INDEPENDENT_AMBULATORY_CARE_PROVIDER_SITE_OTHER): Payer: Federal, State, Local not specified - PPO | Admitting: Physician Assistant

## 2020-09-09 ENCOUNTER — Encounter: Payer: Self-pay | Admitting: Physician Assistant

## 2020-09-09 ENCOUNTER — Other Ambulatory Visit: Payer: Self-pay

## 2020-09-09 VITALS — BP 110/68 | HR 86 | Temp 97.5°F | Ht 66.0 in | Wt 125.0 lb

## 2020-09-09 DIAGNOSIS — M810 Age-related osteoporosis without current pathological fracture: Secondary | ICD-10-CM | POA: Diagnosis not present

## 2020-09-09 DIAGNOSIS — Z0000 Encounter for general adult medical examination without abnormal findings: Secondary | ICD-10-CM | POA: Diagnosis not present

## 2020-09-09 DIAGNOSIS — Z1159 Encounter for screening for other viral diseases: Secondary | ICD-10-CM | POA: Diagnosis not present

## 2020-09-09 DIAGNOSIS — D649 Anemia, unspecified: Secondary | ICD-10-CM | POA: Diagnosis not present

## 2020-09-09 NOTE — Progress Notes (Signed)
I acted as a Education administrator for [employer], PA-C Anselmo Pickler, LPN   Subjective:    Allison [name] is a 34 y.o. female and is here for a comprehensive physical exam.  HPI  Health Maintenance Due  Topic Date Due  . Hepatitis C Screening  Never done    Acute Concerns: None  Chronic Issues: Low bone density for age -- was doing well with PT, still seeing Dr. Truddie Coco at [place] for DEXA at end of the year. Overall has been stable. Sees Charlann Boxer periodically for OMT. Take Vit D regularly for the most part. Anemia -- takes oral iron supplement most days. Denies significant changes to her periods, other than that her cycles seem to overall be shortening with time. Denies: lightheadedness, fatigue, dizziness.  Health Maintenance: Immunizations -- UTD Colonoscopy -- N/A Mammogram -- N/A PAP -- UTD, due  Bone Density -- done 06/2017 Diet -- overall really healthy -- recently did blood food sensitivity testing and has eliminated significant possible triggers, she is doing better (physically) with avoidance of these foods Caffeine intake -- very rare Sleep habits -- night time awakenings due to situational anxiety Exercise -- walking dog daily and PT Current Weight -- Weight: 125 lb (56.7 kg)  Weight History: Wt Readings from Last 10 Encounters:  09/09/20 125 lb (56.7 kg)  09/02/20 126 lb (57.2 kg)  07/30/20 123 lb (55.8 kg)  06/24/20 124 lb (56.2 kg)  05/13/20 125 lb (56.7 kg)  04/29/20 126 lb (57.2 kg)  03/26/20 126 lb (57.2 kg)  02/05/20 125 lb (56.7 kg)  12/27/19 130 lb (59 kg)  12/11/19 130 lb (59 kg)   Body mass index is 20.18 kg/m. Mood -- overall stable, has situational anxiety Patient's last menstrual period was 09/08/2020. Period characteristics -- cycles are shortening Birth control -- none  Depression screen PHQ 2/9 09/09/2020  Decreased Interest 0  Down, Depressed, Hopeless 0  PHQ - 2 Score 0  Altered sleeping -  Tired, decreased energy -    Change in appetite -  Feeling bad or failure about yourself  -  Trouble concentrating -  Moving slowly or fidgety/restless -  Suicidal thoughts -  PHQ-9 Score -     Other providers/specialists: Patient Care Team: Inda Coke, [place] as PCP - General (Physician Assistant) Lesle Reek, NP as Nurse Practitioner (Neurosurgery) Lyndal Pulley, DO as Consulting Physician (Family Medicine) Lona Millard, MD as Referring Physician (Endocrinology) Shelor Melburn Hake, Rex Kras, LCSW as Social Worker (Licensed Clinical Social Worker) Talbert Nan, Francesca Jewett, MD as Consulting Physician (Obstetrics and Gynecology)   PMHx, SurgHx, SocialHx, Medications, and Allergies were reviewed in the Visit Navigator and updated as appropriate.   Past Medical History:  Diagnosis Date  . Chicken pox   . Heart murmur   . History of lumbar compression fracture 06/27/2018   Compression fracture L2-L3 Dexa done 07/05/17 report in chart CT lumbar report from 04/22/17 in chart as well as xray from 04/19/17  . Lactose intolerance 07/11/2018   Age 53  . Osteoporosis, followed by Dr. Buddy Duty, thought to be due to Accutane 07/27/2017   Done density done 07/05/18 report in chart.   . Psoriasis 07/11/2018  . 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 KNEE ARTHROSCOPY Right   . [place] EXTRACTION  2005  Family History  Problem Relation Age of Onset  . Depression Mother   . Miscarriages / [place] Mother   . High Cholesterol Father   . Early death Maternal Grandmother        Brain aneurysm   . Cancer Maternal Grandfather        Prostate  . COPD Paternal Grandmother   . Heart attack Paternal Grandmother     Social History   Tobacco Use  . Smoking status: Never Smoker  . Smokeless tobacco: Never Used  Vaping Use  . Vaping Use:  Never used  Substance Use Topics  . Alcohol use: Yes    Alcohol/week: 2.0 standard drinks    Types: 2 Glasses of wine per week    Comment: twice a month  . Drug use: Never    Review of Systems:   Review of Systems  Constitutional: Negative for chills, fever, malaise/fatigue and weight loss.  HENT: Negative for hearing loss, sinus pain and sore throat.   Eyes: Negative for blurred vision.  Respiratory: Negative for cough and shortness of breath.   Cardiovascular: Negative for chest pain, palpitations and leg swelling.  Gastrointestinal: Negative for abdominal pain, constipation, diarrhea, heartburn, nausea and vomiting.  Genitourinary: Negative for dysuria, frequency and urgency.  Musculoskeletal: Negative for back pain, myalgias and neck pain.  Skin: Negative for itching and rash.  Neurological: Negative for dizziness, tingling, seizures, loss of consciousness and headaches.  Endo/Heme/Allergies: Negative for polydipsia.  Psychiatric/Behavioral: Negative for depression. The patient is not nervous/anxious.   All other systems reviewed and are negative.   Objective:   BP 110/68 (BP Location: Left Arm, Patient Position: Sitting, Cuff Size: Normal)   Pulse 86   Temp (!) 97.5 F (36.4 C) (Temporal)   Ht 5\' 6"  (1.676 m)   Wt 125 lb (56.7 kg)   LMP 09/08/2020   SpO2 98%   BMI 20.18 kg/m   General Appearance:    Alert, cooperative, no distress, appears stated age  Head:    Normocephalic, without obvious abnormality, atraumatic  Eyes:    PERRL, conjunctiva/corneas clear, EOM's intact, fundi    benign, both eyes  Ears:    Normal TM's and external ear canals, both ears  Nose:   Nares normal, septum midline, mucosa normal, no drainage    or sinus tenderness  Throat:   Lips, mucosa, and tongue normal; teeth and gums normal  Neck:   Supple, symmetrical, trachea midline, no adenopathy;    thyroid:  no enlargement/tenderness/nodules; no carotid   bruit or JVD  Back:     Symmetric,  no curvature, ROM normal, no CVA tenderness  Lungs:     Clear to auscultation bilaterally, respirations unlabored  Chest Wall:    No tenderness or deformity   Heart:    Regular rate and rhythm, S1 and S2 normal, no murmur, rub   or gallop  Breast Exam:    Deferred  Abdomen:     Soft, non-tender, bowel sounds active all four quadrants,    no masses, no organomegaly  Genitalia:    Deferred  Rectal:    Deferred  Extremities:   Extremities normal, atraumatic, no cyanosis or edema  Pulses:   2+ and symmetric all extremities  Skin:   Skin color, texture, turgor normal, no rashes or lesions  Lymph nodes:   Cervical, supraclavicular, and axillary nodes normal  Neurologic:   CNII-XII intact, normal strength, sensation and reflexes    throughout     Assessment/Plan:   Allison [name]  was seen today for annual exam.  Diagnoses and all orders for this visit:  Routine physical examination Today patient counseled on age appropriate routine health concerns for screening and prevention, each reviewed and up to date or declined. Immunizations reviewed and up to date or declined. Labs ordered and reviewed. Risk factors for depression reviewed and negative. Hearing function and visual acuity are intact. ADLs screened and addressed as needed. Functional ability and level of safety reviewed and appropriate. Education, counseling and referrals performed based on assessed risks today. Patient provided with a copy of personalized plan for preventive services.  Osteoporosis, unspecified osteoporosis type, unspecified pathological fracture presence Compliant with Vit D supplement. Followed by endocrinology at [facility], further management per their discretion. -     Comprehensive metabolic panel; Future -     VITAMIN D 25 Hydroxy (Vit-D Deficiency, Fractures); Future -     VITAMIN D 25 Hydroxy (Vit-D Deficiency, Fractures) -     Comprehensive metabolic panel  Anemia, unspecified type Overall compliant with oral  iron supplement. Will update labs today per patient request. No red flags. -     CBC with Differential/Platelet; Future -     Iron; Future -     Ferritin; Future -     Ferritin -     Iron -     CBC with Differential/Platelet  Encounter for screening for other viral diseases -     Hepatitis C antibody; Future -     Hepatitis C antibody   Well Adult Exam: Labs ordered: Yes. Patient counseling was done. See below for items discussed. Discussed the patient's BMI. The BMI is in the acceptable range Follow up in one year.  Patient Counseling:   [x]     Nutrition: Stressed importance of moderation in sodium/caffeine intake, saturated fat and cholesterol, caloric balance, sufficient intake of fresh fruits, vegetables, fiber, calcium, iron, and 1 mg of folate supplement per day (for females capable of pregnancy).   [x]      Stressed the importance of regular exercise.    [x]     Substance Abuse: Discussed cessation/primary prevention of tobacco, alcohol, or other drug use; driving or other dangerous activities under the influence; availability of treatment for abuse.    [x]      Injury prevention: Discussed safety belts, safety helmets, smoke detector, smoking near bedding or upholstery.    [x]      Sexuality: Discussed sexually transmitted diseases, partner selection, use of condoms, avoidance of unintended pregnancy  and contraceptive alternatives.    [x]     Dental health: Discussed importance of regular tooth brushing, flossing, and dental visits.   [x]      Health maintenance and immunizations reviewed. Please refer to Health maintenance section.   CMA or LPN served as scribe during this visit. History, Physical, and Plan performed by medical provider. The above documentation has been reviewed and is accurate and complete.   Inda Coke, PA-C [place]

## 2020-09-09 NOTE — Patient Instructions (Signed)
It was great to see you! Best of luck with your move!  Please go to the lab for blood work.   Our office will call you with your results unless you have chosen to receive results via MyChart.  If your blood work is normal we will follow-up each year for physicals and as scheduled for chronic medical problems.  If anything is abnormal we will treat accordingly and get you in for a follow-up.  Take care,  [facility] Maintenance, Female Adopting a healthy lifestyle and getting preventive care are important in promoting health and wellness. Ask your health care provider about:  The right schedule for you to have regular tests and exams.  Things you can do on your own to prevent diseases and keep yourself healthy. What should I know about diet, weight, and exercise? Eat a healthy diet   Eat a diet that includes plenty of vegetables, fruits, low-fat dairy products, and lean protein.  Do not eat a lot of foods that are high in solid fats, added sugars, or sodium. Maintain a healthy weight Body mass index (BMI) is used to identify weight problems. It estimates body fat based on height and weight. Your health care provider can help determine your BMI and help you achieve or maintain a healthy weight. Get regular exercise Get regular exercise. This is one of the most important things you can do for your health. Most adults should:  Exercise for at least 150 minutes each week. The exercise should increase your heart rate and make you sweat (moderate-intensity exercise).  Do strengthening exercises at least twice a week. This is in addition to the moderate-intensity exercise.  Spend less time sitting. Even light physical activity can be beneficial. Watch cholesterol and blood lipids Have your blood tested for lipids and cholesterol at 33 years of age, then have this test every 5 years. Have your cholesterol levels checked more often if:  Your lipid or cholesterol levels are  high.  You are older than 33 years of age.  You are at high risk for heart disease. What should I know about cancer screening? Depending on your health history and family history, you may need to have cancer screening at various ages. This may include screening for:  Breast cancer.  Cervical cancer.  Colorectal cancer.  Skin cancer.  Lung cancer. What should I know about heart disease, diabetes, and high blood pressure? Blood pressure and heart disease  High blood pressure causes heart disease and increases the risk of stroke. This is more likely to develop in people who have high blood pressure readings, are of African descent, or are overweight.  Have your blood pressure checked: ? Every 3-5 years if you are 109-72 years of age. ? Every year if you are 63 years old or older. Diabetes Have regular diabetes screenings. This checks your fasting blood sugar level. Have the screening done:  Once every three years after age 1 if you are at a normal weight and have a low risk for diabetes.  More often and at a younger age if you are overweight or have a high risk for diabetes. What should I know about preventing infection? Hepatitis B If you have a higher risk for hepatitis B, you should be screened for this virus. Talk with your health care provider to find out if you are at risk for hepatitis B infection. Hepatitis C Testing is recommended for:  Everyone born from [birth year] through 1964.  Anyone with known  risk factors for hepatitis C. Sexually transmitted infections (STIs)  Get screened for STIs, including gonorrhea and chlamydia, if: ? You are sexually active and are younger than 33 years of age. ? You are older than 33 years of age and your health care provider tells you that you are at risk for this type of infection. ? Your sexual activity has changed since you were last screened, and you are at increased risk for chlamydia or gonorrhea. Ask your health care provider if you  are at risk.  Ask your health care provider about whether you are at high risk for HIV. Your health care provider may recommend a prescription medicine to help prevent HIV infection. If you choose to take medicine to prevent HIV, you should first get tested for HIV. You should then be tested every 3 months for as long as you are taking the medicine. Pregnancy  If you are about to stop having your period (premenopausal) and you may become pregnant, seek counseling before you get pregnant.  Take 400 to 800 micrograms (mcg) of folic acid every day if you become pregnant.  Ask for birth control (contraception) if you want to prevent pregnancy. Osteoporosis and menopause Osteoporosis is a disease in which the bones lose minerals and strength with aging. This can result in bone fractures. If you are 15 years old or older, or if you are at risk for osteoporosis and fractures, ask your health care provider if you should:  Be screened for bone loss.  Take a calcium or vitamin D supplement to lower your risk of fractures.  Be given hormone replacement therapy (HRT) to treat symptoms of menopause. Follow these instructions at home: Lifestyle  Do not use any products that contain nicotine or tobacco, such as cigarettes, e-cigarettes, and chewing tobacco. If you need help quitting, ask your health care provider.  Do not use street drugs.  Do not share needles.  Ask your health care provider for help if you need support or information about quitting drugs. Alcohol use  Do not drink alcohol if: ? Your health care provider tells you not to drink. ? You are pregnant, may be pregnant, or are planning to become pregnant.  If you drink alcohol: ? Limit how much you use to 0-1 drink a day. ? Limit intake if you are breastfeeding.  Be aware of how much alcohol is in your drink. In the [place] one drink equals one 12 oz bottle of beer (355 mL), one 5 oz glass of wine (148 mL), or one 1 oz glass of hard  liquor (44 mL). General instructions  Schedule regular health, dental, and eye exams.  Stay current with your vaccines.  Tell your health care provider if: ? You often feel depressed. ? You have ever been abused or do not feel safe at home. Summary  Adopting a healthy lifestyle and getting preventive care are important in promoting health and wellness.  Follow your health care provider's instructions about healthy diet, exercising, and getting tested or screened for diseases.  Follow your health care provider's instructions on monitoring your cholesterol and blood pressure. This information is not intended to replace advice given to you by your health care provider. Make sure you discuss any questions you have with your health care provider. Document Revised: 10/26/2018 Document Reviewed: 10/26/2018 Elsevier Patient Education  2020 [employer].

## 2020-09-10 LAB — COMPREHENSIVE METABOLIC PANEL
AG Ratio: 1.8 (calc) (ref 1.0–2.5)
ALT: 13 U/L (ref 6–29)
AST: 13 U/L (ref 10–30)
Albumin: 4.4 g/dL (ref 3.6–5.1)
Alkaline phosphatase (APISO): 56 U/L (ref 31–125)
BUN: 10 mg/dL (ref 7–25)
CO2: 27 mmol/L (ref 20–32)
Calcium: 9.2 mg/dL (ref 8.6–10.2)
Chloride: 105 mmol/L (ref 98–110)
Creat: 0.72 mg/dL (ref 0.50–1.10)
Globulin: 2.4 g/dL (calc) (ref 1.9–3.7)
Glucose, Bld: 91 mg/dL (ref 65–99)
Potassium: 4.1 mmol/L (ref 3.5–5.3)
Sodium: 140 mmol/L (ref 135–146)
Total Bilirubin: 0.7 mg/dL (ref 0.2–1.2)
Total Protein: 6.8 g/dL (ref 6.1–8.1)

## 2020-09-10 LAB — CBC WITH DIFFERENTIAL/PLATELET
Absolute Monocytes: 383 cells/uL (ref 200–950)
Basophils Absolute: 48 cells/uL (ref 0–200)
Basophils Relative: 1.1 %
Eosinophils Absolute: 92 cells/uL (ref 15–500)
Eosinophils Relative: 2.1 %
HCT: 42.2 % (ref 35.0–45.0)
Hemoglobin: 14 g/dL (ref 11.7–15.5)
Lymphs Abs: 1074 cells/uL (ref 850–3900)
MCH: 30 pg (ref 27.0–33.0)
MCHC: 33.2 g/dL (ref 32.0–36.0)
MCV: 90.6 fL (ref 80.0–100.0)
MPV: 10.4 fL (ref 7.5–12.5)
Monocytes Relative: 8.7 %
Neutro Abs: 2803 cells/uL (ref 1500–7800)
Neutrophils Relative %: 63.7 %
Platelets: 240 10*3/uL (ref 140–400)
RBC: 4.66 10*6/uL (ref 3.80–5.10)
RDW: 11.9 % (ref 11.0–15.0)
Total Lymphocyte: 24.4 %
WBC: 4.4 10*3/uL (ref 3.8–10.8)

## 2020-09-10 LAB — HEPATITIS C ANTIBODY
Hepatitis C Ab: NONREACTIVE
SIGNAL TO CUT-OFF: 0.01 (ref <1.00)

## 2020-09-10 LAB — VITAMIN D 25 HYDROXY (VIT D DEFICIENCY, FRACTURES): Vit D, 25-Hydroxy: 26 ng/mL — ABNORMAL LOW (ref 30–100)

## 2020-09-10 LAB — FERRITIN: Ferritin: 27 ng/mL (ref 16–154)

## 2020-09-10 LAB — IRON: Iron: 114 ug/dL (ref 40–190)

## 2020-09-22 DIAGNOSIS — Z20822 Contact with and (suspected) exposure to covid-19: Secondary | ICD-10-CM | POA: Diagnosis not present

## 2020-09-22 DIAGNOSIS — Z03818 Encounter for observation for suspected exposure to other biological agents ruled out: Secondary | ICD-10-CM | POA: Diagnosis not present

## 2020-09-30 ENCOUNTER — Ambulatory Visit: Payer: Federal, State, Local not specified - PPO | Admitting: Family Medicine

## 2020-09-30 ENCOUNTER — Encounter: Payer: Self-pay | Admitting: Family Medicine

## 2020-09-30 ENCOUNTER — Other Ambulatory Visit: Payer: Self-pay

## 2020-09-30 VITALS — BP 98/60 | HR 96 | Ht 66.0 in | Wt 127.0 lb

## 2020-09-30 DIAGNOSIS — M533 Sacrococcygeal disorders, not elsewhere classified: Secondary | ICD-10-CM | POA: Diagnosis not present

## 2020-09-30 DIAGNOSIS — M999 Biomechanical lesion, unspecified: Secondary | ICD-10-CM

## 2020-09-30 NOTE — Progress Notes (Signed)
[place] [address] [place] Phone: [phone] Subjective:   I Allison [name] am serving as a Education administrator for Dr. Hulan Saas.  This visit occurred during the SARS-CoV-2 public health emergency.  Safety protocols were in place, including screening questions prior to the visit, additional usage of staff PPE, and extensive cleaning of exam room while observing appropriate contact time as indicated for disinfecting solutions.   I'm seeing this patient by the request  of:  Inda Coke, [place]  CC: Back pain follow-up  IRJ:[id number]  Allison [name] is a 34 y.o. female coming in with complaint of back and neck pain. OMT 09/02/2020.  Patient states continuing to have some back pain. Patient states overall she is doing well. Had a massage this morning and was told her right side is tight. Sciatic issue on the left side better than before.   Medications patient has been prescribed: None          Reviewed prior external information including notes and imaging from previsou exam, outside providers and external EMR if available.   As well as notes that were available from care everywhere and other healthcare systems.  Past medical history, social, surgical and family history all reviewed in electronic medical record.  No pertanent information unless stated regarding to the chief complaint.   Past Medical History:  Diagnosis Date  . Chicken pox   . Heart murmur   . History of lumbar compression fracture 06/27/2018   Compression fracture L2-L3 Dexa done 07/05/17 report in chart CT lumbar report from 04/22/17 in chart as well as xray from 04/19/17  . Lactose intolerance 07/11/2018   Age 89  . Osteoporosis, followed by Dr. Buddy Duty, thought to be due to Accutane 07/27/2017   Done density done 07/05/18 report in chart.   . Psoriasis 07/11/2018  . 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 Morphine Rash    Age 89 said felt like skin burning tolerated hydrocodone in past      Review of Systems:  No headache, visual changes, nausea, vomiting, diarrhea, constipation, dizziness, abdominal pain, skin rash, fevers, chills, night sweats, weight loss, swollen lymph nodes, body aches, joint swelling, chest pain, shortness of breath, mood changes. POSITIVE muscle aches  Objective  Last menstrual period 09/08/2020.   General: No apparent distress alert and oriented x3 mood and affect normal, dressed appropriately.  HEENT: Pupils equal, extraocular movements intact  Respiratory: Patient's speak in full sentences and does not appear short of breath  Cardiovascular: No lower extremity edema, non tender, no erythema  Gait normal with good balance and coordination.  MSK:  Non tender with full range of motion and good stability and symmetric strength and tone of shoulders, elbows, wrist, hip, knee and ankles bilaterally.  Back -tightness noted in the paraspinal musculature lumbar spine more around the right sacroiliac joint.  Positive Corky Sox.  Does have pain in the parascapular region right greater than left as well.  Negative straight leg test.  Osteopathic findings  C2 flexed rotated and side bent right C6 flexed rotated and side bent left T9 extended rotated and side bent left L2 flexed rotated and side bent right Sacrum left on left Pelvic shear noted with anterior ilium left       Assessment and Plan:  SI (sacroiliac) joint dysfunction Stable at the moment.  Does have some mild tightness.  Had some radicular symptoms on the left side.  Patient also is doing relatively well.  Has a muscle relaxer as needed.  Patient wants to try to avoid to any type of prescription medications though.  Follow-up with me again in 6 weeks.    Nonallopathic problems  Decision today to treat with OMT was based on  Physical Exam  After verbal consent patient was treated with HVLA, ME, FPR techniques in cervical, pelvis , thoracic, lumbar, and sacral  areas  Patient tolerated the procedure well with improvement in symptoms  Patient given exercises, stretches and lifestyle modifications  See medications in patient instructions if given  Patient will follow up in 6-8 weeks      The above documentation has been reviewed and is accurate and complete Lyndal Pulley, DO       Note: This dictation was prepared with Dragon dictation along with smaller phrase technology. Any transcriptional errors that result from this process are unintentional.

## 2020-09-30 NOTE — Patient Instructions (Addendum)
Good to see you Allison [name]  Always here for you See me again in 6-8 weeks

## 2020-09-30 NOTE — Assessment & Plan Note (Signed)
Stable at the moment.  Does have some mild tightness.  Had some radicular symptoms on the left side.  Patient also is doing relatively well.  Has a muscle relaxer as needed.  Patient wants to try to avoid to any type of prescription medications though.  Follow-up with me again in 6 weeks.

## 2020-10-06 DIAGNOSIS — X500XXA Overexertion from strenuous movement or load, initial encounter: Secondary | ICD-10-CM | POA: Diagnosis not present

## 2020-10-06 DIAGNOSIS — S99922A Unspecified injury of left foot, initial encounter: Secondary | ICD-10-CM | POA: Diagnosis not present

## 2020-10-06 DIAGNOSIS — Y9339 Activity, other involving climbing, rappelling and jumping off: Secondary | ICD-10-CM | POA: Diagnosis not present

## 2020-10-06 DIAGNOSIS — S93602A Unspecified sprain of left foot, initial encounter: Secondary | ICD-10-CM | POA: Diagnosis not present

## 2020-10-07 ENCOUNTER — Ambulatory Visit (INDEPENDENT_AMBULATORY_CARE_PROVIDER_SITE_OTHER): Payer: Federal, State, Local not specified - PPO | Admitting: Psychology

## 2020-10-07 DIAGNOSIS — F4323 Adjustment disorder with mixed anxiety and depressed mood: Secondary | ICD-10-CM | POA: Diagnosis not present

## 2020-10-14 ENCOUNTER — Ambulatory Visit: Payer: Federal, State, Local not specified - PPO | Admitting: Family Medicine

## 2020-10-21 DIAGNOSIS — S93402A Sprain of unspecified ligament of left ankle, initial encounter: Secondary | ICD-10-CM | POA: Diagnosis not present

## 2020-10-21 DIAGNOSIS — S93422A Sprain of deltoid ligament of left ankle, initial encounter: Secondary | ICD-10-CM | POA: Diagnosis not present

## 2020-10-21 DIAGNOSIS — M810 Age-related osteoporosis without current pathological fracture: Secondary | ICD-10-CM | POA: Diagnosis not present

## 2020-10-21 DIAGNOSIS — M25572 Pain in left ankle and joints of left foot: Secondary | ICD-10-CM | POA: Diagnosis not present

## 2020-10-21 DIAGNOSIS — S93492A Sprain of other ligament of left ankle, initial encounter: Secondary | ICD-10-CM | POA: Diagnosis not present

## 2020-10-21 DIAGNOSIS — M79672 Pain in left foot: Secondary | ICD-10-CM | POA: Diagnosis not present

## 2020-10-24 ENCOUNTER — Encounter: Payer: Self-pay | Admitting: Family Medicine

## 2020-11-04 NOTE — Progress Notes (Signed)
[place] [address] [place] Phone: [phone] Subjective:   I Allison [name] am serving as a Education administrator for Dr. Hulan Saas.  This visit occurred during the SARS-CoV-2 public health emergency.  Safety protocols were in place, including screening questions prior to the visit, additional usage of staff PPE, and extensive cleaning of exam room while observing appropriate contact time as indicated for disinfecting solutions.   I'm seeing this patient by the request  of:  Inda Coke, [place]  CC: Low back and neck pain follow-up  [id number]  Krystall Kruckenberg is a 34 y.o. female coming in with complaint of back and neck pain. OMT 09/30/2020. Patient states that she suffered a left ankle injury since last visit that is causing her to walk with antalgic gait and thus increasing her back pain. About 4.5 weeks ago she sprained her left foot. Wore a boot and has been out of it for 4 days. Using ankle brace now. States the boot threw her back out.  Patient denies any new pain just worsening of previous pain.  Medications patient has been prescribed:   Taking:      Per review of patient's side records patient was seen by orthopedic on October 21, 2020.  Patient did have x-rays at that time and then discussed there was no bony abnormality of the ankle.   Reviewed prior external information including notes and imaging from previsou exam, outside providers and external EMR if available.   As well as notes that were available from care everywhere and other healthcare systems.  Past medical history, social, surgical and family history all reviewed in electronic medical record.  No pertanent information unless stated regarding to the chief complaint.   Past Medical History:  Diagnosis Date  . Chicken pox   . Heart murmur   . History of lumbar compression fracture 06/27/2018   Compression fracture L2-L3 Dexa done 07/05/17 report in chart CT  lumbar report from 04/22/17 in chart as well as xray from 04/19/17  . Lactose intolerance 07/11/2018   Age 2  . Osteoporosis, followed by Dr. Buddy Duty, thought to be due to Accutane 07/27/2017   Done density done 07/05/18 report in chart.   . Psoriasis 07/11/2018  . 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 Morphine Rash    Age 2 said felt like skin burning tolerated hydrocodone in past      Review of Systems:  No headache, visual changes, nausea, vomiting, diarrhea, constipation, dizziness, abdominal pain, skin rash, fevers, chills, night sweats, weight loss, swollen lymph nodes,joint swelling, chest pain, shortness of breath, mood changes. POSITIVE muscle aches, body aches  Objective  Blood pressure 100/80, pulse 80, height 5\' 6"  (1.676 m), weight 128 lb (58.1 kg), SpO2 99 %.   General: No apparent distress alert and oriented x3 mood and affect normal, dressed appropriately.  HEENT: Pupils equal, extraocular movements intact  Respiratory: Patient's speak in full sentences and does not appear short of breath  Cardiovascular: No lower extremity edema, non tender, no erythema  Neuro: Cranial nerves II through XII are intact, neurovascularly intact in all extremities with 2+ DTRs and 2+ pulses.  Gait normal with good balance and coordination.  MSK: Left ankle exam is fairly unremarkable.  Very mild discomfort on the dorsum of the foot. Back -low back exam does have some mild  loss of lordosis, some tightness noted with thoracolumbar junction.  Mild tightness of the right sacroiliac joint as well.  Osteopathic findings  C4 flexed rotated and side bent right T3 extended rotated and side bent right inhaled rib T8 extended rotated and side bent left L2 flexed rotated and side bent right Sacrum right on right       Assessment and Plan:  SI  (sacroiliac) joint dysfunction Chronic, with mild exacerbation.  Secondary to more of the monitor.  Patient did not have more tightness.  Discussed home exercise and icing regimen.  Which activities to do which wants to avoid.  Patient is to increase activity slowly.  Follow-up with me again in 4 to 8 weeks    Nonallopathic problems  Decision today to treat with OMT was based on Physical Exam  After verbal consent patient was treated with HVLA, ME, FPR techniques in cervical, rib, thoracic, lumbar, and sacral  areas  Patient tolerated the procedure well with improvement in symptoms  Patient given exercises, stretches and lifestyle modifications  See medications in patient instructions if given  Patient will follow up in 4-8 weeks      The above documentation has been reviewed and is accurate and complete Lyndal Pulley, DO       Note: This dictation was prepared with Dragon dictation along with smaller phrase technology. Any transcriptional errors that result from this process are unintentional.

## 2020-11-05 ENCOUNTER — Other Ambulatory Visit: Payer: Self-pay

## 2020-11-05 ENCOUNTER — Encounter: Payer: Self-pay | Admitting: Family Medicine

## 2020-11-05 ENCOUNTER — Ambulatory Visit: Payer: Federal, State, Local not specified - PPO | Admitting: Family Medicine

## 2020-11-05 VITALS — BP 100/80 | HR 80 | Ht 66.0 in | Wt 128.0 lb

## 2020-11-05 DIAGNOSIS — M533 Sacrococcygeal disorders, not elsewhere classified: Secondary | ICD-10-CM | POA: Diagnosis not present

## 2020-11-05 DIAGNOSIS — M999 Biomechanical lesion, unspecified: Secondary | ICD-10-CM | POA: Diagnosis not present

## 2020-11-05 NOTE — Assessment & Plan Note (Signed)
Chronic, with mild exacerbation.  Secondary to more of the monitor.  Patient did not have more tightness.  Discussed home exercise and icing regimen.  Which activities to do which wants to avoid.  Patient is to increase activity slowly.  Follow-up with me again in 4 to 8 weeks

## 2020-11-05 NOTE — Patient Instructions (Addendum)
Good to see you Tightness today Maybe another massage Ankle exercises  See me again in as scheduled. If doing well schedule for 4 weeks

## 2020-11-14 NOTE — Progress Notes (Signed)
[place] Phone: [phone] Subjective:   Allison Allison [name], am serving as a scribe for Dr. Hulan Allison [name]. This visit occurred during the SARS-CoV-2 public health emergency.  Safety protocols were in place, including screening questions prior to the visit, additional usage of staff PPE, and extensive cleaning of exam room while observing appropriate contact time as indicated for disinfecting solutions.   I'm seeing this patient by the request  of:  Allison Allison [name], [place]  CC: Neck and back pain follow-up  [id number]  Allison Allison [name] is a 34 y.o. female coming in with complaint of back and neck pain. OMT 11/05/2020. Patient states that her back has been doing well the past few days.  Patient continues to have left foot pain.  Did have the ankle injury previously.  Now having discomfort still over the dorsum of the foot.  Seem to be more of a contusion but now not making as much progress.  Feels like she has plateaued.  Patient is supposed to be walking around [place] in the next 3 weeks.  Ankle pain continues. Pain is more in metatarsals. Pain with PF.   Medications patient has been prescribed: Flexeril   Taking: Allison [name]         Reviewed prior external information including notes and imaging from previsou exam, outside providers and external EMR if available.   As well as notes that were available from care everywhere and other healthcare systems.  Past medical history, social, surgical and family history all reviewed in electronic medical record.  Allison [name] unless stated regarding to the chief complaint.   Past Medical History:  Diagnosis Date  . Chicken pox   . Heart murmur   . History of lumbar compression fracture 06/27/2018   Compression fracture L2-L3 Dexa done 07/05/17 report in chart CT lumbar report from 04/22/17 in chart as well as xray from 04/19/17  . Lactose intolerance 07/11/2018   Age 89   . Osteoporosis, followed by Dr. Buddy [name], thought to be due to Accutane 07/27/2017   Done density done 07/05/18 report in chart.   . Psoriasis 07/11/2018  . 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 Morphine Rash    Age 89 said felt like skin burning tolerated hydrocodone in past      Review of Systems:  Allison [name] headache, visual changes, nausea, vomiting, diarrhea, constipation, dizziness, abdominal pain, skin rash, fevers, chills, night sweats, weight loss, swollen lymph nodes, body aches, joint swelling, chest pain, shortness of breath, mood changes. POSITIVE muscle aches  Objective  Blood pressure 108/78, pulse 98, height 5\' 6"  (1.676 m), weight 130 lb (59 kg), last menstrual period 11/05/2020, SpO2 99 %.   General: Allison [name] apparent distress alert and oriented x3 mood and affect normal, dressed appropriately.  HEENT: Pupils equal, extraocular movements intact  Respiratory: Patient's speak in full sentences and does not appear short of breath  Cardiovascular: Allison [name] lower extremity edema, non tender, Allison [name] erythema  Left foot exam shows that patient is minorly tender over the midfoot on the dorsal aspect only.  Tightness noted of the longitudinal arch noted.  Patient is nontender over the calcaneal region.  Allison [name] significant tenderness over the medial malleolus or the lateral malleolus.  Mild tightness of the peroneal tendons is noted.  Neurovascularly intact distally  Low back exam still has  some tightness around the sacroiliac joint right greater than left.  Negative straight leg test.  Tender over the sacrum 5-5 strength in lower extremities  Osteopathic findings   C6 flexed rotated and side bent left T3 extended rotated and side bent right inhaled rib T9 extended rotated and side bent left L2 flexed rotated and side bent right Sacrum right on right        Assessment and Plan:  Contusion of dorsum of foot Initially had a contusion of the foot.  Concern for potential stress fracture.  Patient has had a compression fracture of the back.  Discussed K2, continue the vitamin D, discussed icing regimen.  Increase activity slowly.  Carbon fiber plate.  Follow-up again 4 to 8 weeks  SI (sacroiliac) joint dysfunction Stable.  Patient seems to be doing well.  Discussed posture and ergonomics.  Discussed the muscle relaxer as needed.  Patient will be traveling out of the country potentially in 4 weeks.  We will see patient back again in 6 weeks    Nonallopathic problems  Decision today to treat with OMT was based on Physical Exam  After verbal consent patient was treated with HVLA, ME, FPR techniques in cervical, rib, thoracic, lumbar, and sacral  areas  Patient tolerated the procedure well with improvement in symptoms  Patient given exercises, stretches and lifestyle modifications  See medications in patient instructions if given  Patient will follow up in 4-8 weeks      The above documentation has been reviewed and is accurate and complete Allison Saa, DO       Note: This dictation was prepared with Dragon dictation along with smaller phrase technology. Any transcriptional errors that result from this process are unintentional.

## 2020-11-19 ENCOUNTER — Other Ambulatory Visit: Payer: Self-pay

## 2020-11-19 ENCOUNTER — Ambulatory Visit: Payer: Federal, State, Local not specified - PPO | Admitting: Family Medicine

## 2020-11-19 ENCOUNTER — Encounter: Payer: Self-pay | Admitting: Family Medicine

## 2020-11-19 ENCOUNTER — Ambulatory Visit (INDEPENDENT_AMBULATORY_CARE_PROVIDER_SITE_OTHER): Payer: Federal, State, Local not specified - PPO

## 2020-11-19 VITALS — BP 108/78 | HR 98 | Ht 66.0 in | Wt 130.0 lb

## 2020-11-19 DIAGNOSIS — M79672 Pain in left foot: Secondary | ICD-10-CM

## 2020-11-19 DIAGNOSIS — M999 Biomechanical lesion, unspecified: Secondary | ICD-10-CM | POA: Diagnosis not present

## 2020-11-19 DIAGNOSIS — M533 Sacrococcygeal disorders, not elsewhere classified: Secondary | ICD-10-CM

## 2020-11-19 DIAGNOSIS — S99922A Unspecified injury of left foot, initial encounter: Secondary | ICD-10-CM | POA: Diagnosis not present

## 2020-11-19 DIAGNOSIS — S9030XA Contusion of unspecified foot, initial encounter: Secondary | ICD-10-CM | POA: Diagnosis not present

## 2020-11-19 NOTE — Patient Instructions (Signed)
Xray today Carbon fiber plate Try heel lift but if it make foot worse, stop it K2 for one month See me again in 5-6 weeks

## 2020-11-19 NOTE — Assessment & Plan Note (Signed)
Stable.  Patient seems to be doing well.  Discussed posture and ergonomics.  Discussed the muscle relaxer as needed.  Patient will be traveling out of the country potentially in 4 weeks.  We will see patient back again in 6 weeks

## 2020-11-19 NOTE — Assessment & Plan Note (Signed)
Initially had a contusion of the foot.  Concern for potential stress fracture.  Patient has had a compression fracture of the back.  Discussed K2, continue the vitamin D, discussed icing regimen.  Increase activity slowly.  Carbon fiber plate.  Follow-up again 4 to 8 weeks

## 2020-11-29 ENCOUNTER — Ambulatory Visit (INDEPENDENT_AMBULATORY_CARE_PROVIDER_SITE_OTHER): Payer: Federal, State, Local not specified - PPO | Admitting: Psychology

## 2020-11-29 DIAGNOSIS — F4323 Adjustment disorder with mixed anxiety and depressed mood: Secondary | ICD-10-CM | POA: Diagnosis not present

## 2020-12-03 DIAGNOSIS — Z20822 Contact with and (suspected) exposure to covid-19: Secondary | ICD-10-CM | POA: Diagnosis not present

## 2020-12-05 DIAGNOSIS — Z03818 Encounter for observation for suspected exposure to other biological agents ruled out: Secondary | ICD-10-CM | POA: Diagnosis not present

## 2020-12-05 DIAGNOSIS — Z20822 Contact with and (suspected) exposure to covid-19: Secondary | ICD-10-CM | POA: Diagnosis not present

## 2021-01-06 ENCOUNTER — Ambulatory Visit: Payer: Federal, State, Local not specified - PPO | Admitting: Family Medicine

## 2021-01-09 ENCOUNTER — Ambulatory Visit (INDEPENDENT_AMBULATORY_CARE_PROVIDER_SITE_OTHER): Payer: Federal, State, Local not specified - PPO | Admitting: Psychology

## 2021-01-09 DIAGNOSIS — F4323 Adjustment disorder with mixed anxiety and depressed mood: Secondary | ICD-10-CM

## 2021-01-16 NOTE — Progress Notes (Signed)
[place] [address] [place] Phone: [phone] Subjective:   I Allison [name] am serving as a Education administrator for Dr. Hulan [name].  This visit occurred during the SARS-CoV-2 public health emergency.  Safety protocols were in place, including screening questions prior to the visit, additional usage of staff PPE, and extensive cleaning of exam room while observing appropriate contact time as indicated for disinfecting solutions.   I'm seeing this patient by the request  of:  Allison [name], [place]  CC: Back pain follow-up  [id number]  Allison [name] is a 35 y.o. female coming in with complaint of back and neck pain. OMT 11/19/2020. Also f/u for left foot contusion. Patient states her foot is doing well. Sometimes swells with long walking. Upper right sided back pain. More upper back pain that still has the lower back pain as well.          Reviewed prior external information including notes and imaging from previsou exam, outside providers and external EMR if available.   As well as notes that were available from care everywhere and other healthcare systems.  Past medical history, social, surgical and family history all reviewed in electronic medical record.  No pertanent information unless stated regarding to the chief complaint.   Past Medical History:  Diagnosis Date  . Chicken pox   . Heart murmur   . History of lumbar compression fracture 06/27/2018   Compression fracture L2-L3 Dexa done 07/05/17 report in chart CT lumbar report from 04/22/17 in chart as well as xray from 04/19/17  . Lactose intolerance 07/11/2018   Age 50  . Osteoporosis, followed by Dr. Buddy [name], thought to be due to Accutane 07/27/2017   Done density done 07/05/18 report in chart.   . Psoriasis 07/11/2018  . 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 Morphine Rash    Age 50 said felt like skin burning tolerated hydrocodone in past      Review of Systems:  No headache, visual changes, nausea, vomiting, diarrhea, constipation, dizziness, abdominal pain, skin rash, fevers, chills, night sweats, weight loss, swollen lymph nodes, body aches, joint swelling, chest pain, shortness of breath, mood changes. POSITIVE muscle aches but mild  Objective  Blood pressure 110/80, pulse 83, height 5\' 6"  (1.676 m), weight 129 lb (58.5 kg), SpO2 100 %.   General: No apparent distress alert and oriented x3 mood and affect normal, dressed appropriately.  HEENT: Pupils equal, extraocular movements intact  Respiratory: Patient's speak in full sentences and does not appear short of breath  Cardiovascular: No lower extremity edema, non tender, no erythema  Gait normal with good balance and coordination.  MSK:  Non tender with full range of motion and good stability and symmetric strength and tone of shoulders, elbows, wrist, hip, knee and ankles bilaterally.  Back -no tenderness around the right sacroiliac joint. Patient does have more tightness in the right parascapular region. Tightness noted in the neck as well but negative Spurling's. 5 5 strength of the upper extremities.  Osteopathic findings  C2 flexed rotated and side bent right T3 extended rotated and side bent right inhaled rib T9 extended rotated and side bent left L2 flexed rotated and side bent left  L5 flexed rotated and side bent left Sacrum right on right       Assessment and Plan:  SI (sacroiliac)  joint dysfunction Patient does have some tightness noted. More on the right side still. Patient has been driving a little bit more. Patient does have the exercises and overall does relatively well. We will continue to monitor. Follow-up with me again 2 months  Contusion of dorsum of foot Patient still has some intermittent swelling she states. Patient's  previous x-rays were unremarkable continue good shoes. Discussed the metatarsal pads, follow-up again in 8 weeks    Nonallopathic problems  Decision today to treat with OMT was based on Physical Exam  After verbal consent patient was treated with HVLA, ME, FPR techniques in cervical, rib, thoracic, lumbar, and sacral  areas  Patient tolerated the procedure well with improvement in symptoms  Patient given exercises, stretches and lifestyle modifications  See medications in patient instructions if given  Patient will follow up in 4-8 weeks      The above documentation has been reviewed and is accurate and complete Lyndal Pulley, DO       Note: This dictation was prepared with Dragon dictation along with smaller phrase technology. Any transcriptional errors that result from this process are unintentional.

## 2021-01-17 ENCOUNTER — Ambulatory Visit: Payer: Federal, State, Local not specified - PPO | Admitting: Family Medicine

## 2021-01-17 ENCOUNTER — Other Ambulatory Visit: Payer: Self-pay

## 2021-01-17 ENCOUNTER — Encounter: Payer: Self-pay | Admitting: Family Medicine

## 2021-01-17 VITALS — BP 110/80 | HR 83 | Ht 66.0 in | Wt 129.0 lb

## 2021-01-17 DIAGNOSIS — M999 Biomechanical lesion, unspecified: Secondary | ICD-10-CM

## 2021-01-17 DIAGNOSIS — S9030XA Contusion of unspecified foot, initial encounter: Secondary | ICD-10-CM | POA: Diagnosis not present

## 2021-01-17 DIAGNOSIS — M533 Sacrococcygeal disorders, not elsewhere classified: Secondary | ICD-10-CM

## 2021-01-17 NOTE — Assessment & Plan Note (Signed)
Patient does have some tightness noted. More on the right side still. Patient has been driving a little bit more. Patient does have the exercises and overall does relatively well. We will continue to monitor. Follow-up with me again 2 months

## 2021-01-17 NOTE — Assessment & Plan Note (Signed)
Patient still has some intermittent swelling she states. Patient's previous x-rays were unremarkable continue good shoes. Discussed the metatarsal pads, follow-up again in 8 weeks

## 2021-01-17 NOTE — Patient Instructions (Signed)
Good to see you Good luck with opportunities You are awesome See me again in 6-8 weeks

## 2021-02-18 ENCOUNTER — Ambulatory Visit (INDEPENDENT_AMBULATORY_CARE_PROVIDER_SITE_OTHER): Payer: Federal, State, Local not specified - PPO | Admitting: Psychology

## 2021-02-18 DIAGNOSIS — F4323 Adjustment disorder with mixed anxiety and depressed mood: Secondary | ICD-10-CM

## 2021-03-10 NOTE — Progress Notes (Signed)
[place] [address] [place] Phone: [phone] Subjective:   I Allison [name] am serving as a Education administrator for Dr. Hulan Saas.  This visit occurred during the SARS-CoV-2 public health emergency.  Safety protocols were in place, including screening questions prior to the visit, additional usage of staff PPE, and extensive cleaning of exam room while observing appropriate contact time as indicated for disinfecting solutions.   I'm seeing this patient by the request  of:  Inda Coke, [place]  CC: Back and neck pain follow-up  [id number]  Allison [name] is a 35 y.o. female coming in with complaint of back and neck pain. OMT 01/17/2021. Patient states she recently moved and feels out of alignment.  Patient feels more tightness recently.  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  Because of this as well as having to transfer to a shuttle patient is having more discomfort and pain again.  Patient feels like it is affecting her job performance to a certain degree.  Patient feels like she can somewhat do it but is having more difficulty and would like to be more comfortable throughout her work experience.  Medications patient has been prescribed: None  Taking:         Reviewed prior external information including notes and imaging from previsou exam, outside providers and external EMR if available.   As well as notes that were available from care everywhere and other healthcare systems.  Past medical history, social, surgical and family history all reviewed in electronic medical record.  No pertanent information unless stated regarding to the chief complaint.   Past Medical History:  Diagnosis Date  . Chicken pox   . Heart murmur   . History of lumbar compression fracture 06/27/2018   Compression fracture L2-L3 Dexa done 07/05/17 report in chart CT lumbar  report from 04/22/17 in chart as well as xray from 04/19/17  . Lactose intolerance 07/11/2018   Age 89  . Osteoporosis, followed by Dr. Buddy Duty, thought to be due to Accutane 07/27/2017   Done density done 07/05/18 report in chart.   . Psoriasis 07/11/2018  . 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 Morphine Rash    Age 89 said felt like skin burning tolerated hydrocodone in past      Review of Systems:  No headache, visual changes, nausea, vomiting, diarrhea, constipation, dizziness, abdominal pain, skin rash, fevers, chills, night sweats, weight loss, swollen lymph nodes,  joint swelling, chest pain, shortness of breath, mood changes. POSITIVE muscle aches, body aches  Objective  Blood pressure 100/70, pulse 85, height 5\' 6"  (1.676 m), weight 125 lb (56.7 kg), SpO2 100 %.   General: No apparent distress alert and oriented x3 mood and affect normal, dressed appropriately.  HEENT: Pupils equal, extraocular movements intact  Respiratory: Patient's speak in full sentences and does not appear short of breath  Cardiovascular: No lower extremity edema, non tender, no erythema  Gait normal with good balance and coordination.  MSK:  Non tender with full range of motion and good stability and symmetric strength and tone of shoulders, elbows, wrist, hip, knee and ankles bilaterally.  Back -upper back does have some discomfort noted.  Still seems to be more in the right paraspinal musculature around  the scapular region.  Slight bruit noted.  Patient is neck some mild tightness with sidebending bilaterally.  Lower back tightness noted bilaterally with Faber bilaterally.  More discomfort over the right sacroiliac joint.  Osteopathic findings  C2 flexed rotated and side bent right T5 extended rotated and side bent right inhaled rib T9 extended rotated and side  bent left L2 flexed rotated and side bent right Sacrum right on right       Assessment and Plan: Slipped rib syndrome Patient had slipped rib syndrome as well as sacroiliac dysfunction.  Patient though has been doing relatively well.  He recently did need unfortunately also now with a new job and patient continued has had some mild exacerbation.  We will fill out forms to allow patient to potentially park closer which I think will help with the overall wellbeing of the individual and less discomfort and pain.  No significant changes in medication.  Patient does have Flexeril for breakthrough.  Follow-up with me again in 2 months.     Nonallopathic problems  Decision today to treat with OMT was based on Physical Exam  After verbal consent patient was treated with HVLA, ME, FPR techniques in cervical, rib, thoracic, lumbar, and sacral  areas  Patient tolerated the procedure well with improvement in symptoms  Patient given exercises, stretches and lifestyle modifications  See medications in patient instructions if given  Patient will follow up in 4-8 weeks      The above documentation has been reviewed and is accurate and complete Lyndal Pulley, DO       Note: This dictation was prepared with Dragon dictation along with smaller phrase technology. Any transcriptional errors that result from this process are unintentional.

## 2021-03-11 ENCOUNTER — Ambulatory Visit: Payer: Federal, State, Local not specified - PPO | Admitting: Family Medicine

## 2021-03-11 ENCOUNTER — Other Ambulatory Visit: Payer: Self-pay

## 2021-03-11 ENCOUNTER — Encounter: Payer: Self-pay | Admitting: Family Medicine

## 2021-03-11 VITALS — BP 100/70 | HR 85 | Ht 66.0 in | Wt 125.0 lb

## 2021-03-11 DIAGNOSIS — M9908 Segmental and somatic dysfunction of rib cage: Secondary | ICD-10-CM | POA: Diagnosis not present

## 2021-03-11 DIAGNOSIS — M9903 Segmental and somatic dysfunction of lumbar region: Secondary | ICD-10-CM | POA: Diagnosis not present

## 2021-03-11 DIAGNOSIS — M9902 Segmental and somatic dysfunction of thoracic region: Secondary | ICD-10-CM

## 2021-03-11 DIAGNOSIS — M940 Chondrocostal junction syndrome [Tietze]: Secondary | ICD-10-CM | POA: Diagnosis not present

## 2021-03-11 DIAGNOSIS — M9901 Segmental and somatic dysfunction of cervical region: Secondary | ICD-10-CM

## 2021-03-11 DIAGNOSIS — M9904 Segmental and somatic dysfunction of sacral region: Secondary | ICD-10-CM | POA: Diagnosis not present

## 2021-03-11 DIAGNOSIS — M533 Sacrococcygeal disorders, not elsewhere classified: Secondary | ICD-10-CM

## 2021-03-11 NOTE — Patient Instructions (Signed)
Good to see you Thank your husband for warming you up for me Thanks for showing me the video Doing well See me again as scheduled

## 2021-03-11 NOTE — Assessment & Plan Note (Signed)
Patient had slipped rib syndrome as well as sacroiliac dysfunction.  Patient though has been doing relatively well.  He recently did need unfortunately also now with a new job and patient continued has had some mild exacerbation.  We will fill out forms to allow patient to potentially park closer which I think will help with the overall wellbeing of the individual and less discomfort and pain.  No significant changes in medication.  Patient does have Flexeril for breakthrough.  Follow-up with me again in 2 months.

## 2021-03-31 ENCOUNTER — Ambulatory Visit (INDEPENDENT_AMBULATORY_CARE_PROVIDER_SITE_OTHER): Payer: Federal, State, Local not specified - PPO | Admitting: Psychology

## 2021-03-31 DIAGNOSIS — F4323 Adjustment disorder with mixed anxiety and depressed mood: Secondary | ICD-10-CM | POA: Diagnosis not present

## 2021-04-04 ENCOUNTER — Ambulatory Visit: Payer: Federal, State, Local not specified - PPO | Admitting: Psychology

## 2021-04-25 NOTE — Progress Notes (Signed)
[facility] [address] [place] Phone: [phone] Subjective:   I Allison [name] am serving as a Education administrator for Dr. Hulan Saas.  This visit occurred during the SARS-CoV-2 public health emergency.  Safety protocols were in place, including screening questions prior to the visit, additional usage of staff PPE, and extensive cleaning of exam room while observing appropriate contact time as indicated for disinfecting solutions.   I'm seeing this patient by the request  of:  Inda Coke, [place]  CC: mid and low back pain   [id number]  Allison [name] is a 35 y.o. female coming in with complaint of back and neck pain. OMT 03/11/2021. Patient states the past few weeks she has been worse. Had a massage 2 weeks ago that has helped. Pulling in the right side of her neck.  Denies any radiation down the arm.  Patient states that this has been chronic.  Comes with some mild increase in low back pain as well.  Nothing severe though.  Continue to stay active.  Medications patient has been prescribed: Flexeril very sparingly           Reviewed prior external information including notes and imaging from previsou exam, outside providers and external EMR if available.   As well as notes that were available from care everywhere and other healthcare systems.  Past medical history, social, surgical and family history all reviewed in electronic medical record.  No pertanent information unless stated regarding to the chief complaint.   Past Medical History:  Diagnosis Date   Chicken pox    Heart murmur    History of lumbar compression fracture 06/27/2018   Compression fracture L2-L3 Dexa done 07/05/17 report in chart CT lumbar report from 04/22/17 in chart as well as xray from 04/19/17   Lactose intolerance 07/11/2018   Age 26   Osteoporosis, followed by Dr. Buddy Duty, thought to be due to Accutane 07/27/2017   Done density done 07/05/18 report in chart.     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 POSITIVE muscle aches, body aches  Objective  Blood pressure 100/70, pulse 80, height 5\' 6"  (1.676 m), weight 126 lb (57.2 kg), SpO2 100 %.   General: No apparent distress alert and oriented x3 mood and affect normal, dressed appropriately.  HEENT: Pupils equal, extraocular movements intact  Respiratory: Patient's speak in full sentences and does not appear short of breath  Cardiovascular: No lower extremity edema, non tender, no erythema  Back exam shows the patient does have some mild tightness noted on the right parascapular region.  Osteopathic findings  C2 flexed rotated and side bent right C6 flexed rotated and side bent left T3 extended rotated and side bent right inhaled rib T4 extended rotated and side bent right with inhaled rib L2 flexed rotated and side bent right L5 flexed rotated and side bent left Sacrum right on right Pelvic shear noted right      Assessment and Plan:  SI (sacroiliac) joint dysfunction Pelvis stable.  Discussed with her regimen and home exercises, discussed which activities to do opioids to avoid.  Activity slowly patient has been doing very well.  No significant change in medications.  Follow-up with me again 6 to 8 weeks   Nonallopathic problems  Decision today to treat with OMT was based on Physical Exam  After verbal consent patient was treated with HVLA, ME, FPR techniques in cervical, rib,  thoracic, lumbar, and sacral and pelvis areas  Patient tolerated the procedure well with improvement in symptoms  Patient given exercises, stretches and lifestyle modifications  See medications in patient instructions if given  Patient will follow up in 4-8 weeks      The above documentation has been reviewed and is accurate and complete Lyndal Pulley, DO        Note: This dictation was prepared with Dragon dictation along with smaller phrase technology. Any transcriptional errors that result from this process are unintentional.

## 2021-04-28 ENCOUNTER — Encounter: Payer: Self-pay | Admitting: Family Medicine

## 2021-04-28 ENCOUNTER — Ambulatory Visit (INDEPENDENT_AMBULATORY_CARE_PROVIDER_SITE_OTHER): Payer: Federal, State, Local not specified - PPO

## 2021-04-28 ENCOUNTER — Ambulatory Visit: Payer: Federal, State, Local not specified - PPO | Admitting: Family Medicine

## 2021-04-28 ENCOUNTER — Other Ambulatory Visit: Payer: Self-pay

## 2021-04-28 VITALS — BP 100/70 | HR 80 | Ht 66.0 in | Wt 126.0 lb

## 2021-04-28 DIAGNOSIS — M9903 Segmental and somatic dysfunction of lumbar region: Secondary | ICD-10-CM | POA: Diagnosis not present

## 2021-04-28 DIAGNOSIS — M533 Sacrococcygeal disorders, not elsewhere classified: Secondary | ICD-10-CM

## 2021-04-28 DIAGNOSIS — M9905 Segmental and somatic dysfunction of pelvic region: Secondary | ICD-10-CM | POA: Diagnosis not present

## 2021-04-28 DIAGNOSIS — M9902 Segmental and somatic dysfunction of thoracic region: Secondary | ICD-10-CM

## 2021-04-28 DIAGNOSIS — M9908 Segmental and somatic dysfunction of rib cage: Secondary | ICD-10-CM | POA: Diagnosis not present

## 2021-04-28 DIAGNOSIS — M546 Pain in thoracic spine: Secondary | ICD-10-CM | POA: Diagnosis not present

## 2021-04-28 DIAGNOSIS — M9901 Segmental and somatic dysfunction of cervical region: Secondary | ICD-10-CM | POA: Diagnosis not present

## 2021-04-28 DIAGNOSIS — M9904 Segmental and somatic dysfunction of sacral region: Secondary | ICD-10-CM

## 2021-04-28 MED ORDER — CYCLOBENZAPRINE HCL 5 MG PO TABS: 5.0000 mg | ORAL_TABLET | Freq: Three times a day (TID) | ORAL | 0 refills | Status: AC | PRN

## 2021-04-28 NOTE — Patient Instructions (Addendum)
Good to see you Thoracic xray Consider a vertical mouse See me again in 6 weeks

## 2021-04-28 NOTE — Assessment & Plan Note (Signed)
Pelvis stable.  Discussed with her regimen and home exercises, discussed which activities to do opioids to avoid.  Activity slowly patient has been doing very well.  No significant change in medications.  Follow-up with me again 6 to 8 weeks

## 2021-05-21 ENCOUNTER — Encounter: Payer: Self-pay | Admitting: Family Medicine

## 2021-05-27 NOTE — Progress Notes (Signed)
[name] [place] Phone: [phone] Subjective:   Allison [name], am serving as a scribe for Dr. Hulan Saas.  I'm seeing this patient by the request  of:  Inda Coke, [place]  CC: Back and neck pain follow-up  [id number]  Allison [name] is a 35 y.o. female coming in with complaint of back and neck pain. OMT 04/28/2021. Patient states a [place] ago at the beach in [place], Left upper thoracic back twinge and it will hurt to cough, sneeze, take a deep breath. Patient had a massage that helped the pain but has some clicking on the front left side of the chest.   Medications patient has been prescribed: None          Reviewed prior external information including notes and imaging from previsou exam, outside providers and external EMR if available.   As well as notes that were available from care everywhere and other healthcare systems.  Past medical history, social, surgical and family history all reviewed in electronic medical record.  No pertanent information unless stated regarding to the chief complaint.   Past Medical History:  Diagnosis Date   Chicken pox    Heart murmur    History of lumbar compression fracture 06/27/2018   Compression fracture L2-L3 Dexa done 07/05/17 report in chart CT lumbar report from 04/22/17 in chart as well as xray from 04/19/17   Lactose intolerance 07/11/2018   Age 63   Osteoporosis, followed by Dr. Buddy Duty, thought to be due to Accutane 07/27/2017   Done density done 07/05/18 report in chart.    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 POSITIVE muscle aches  Objective  Blood pressure 102/70, pulse 75, height 5\' 6"  (1.676 m), weight 125 lb (56.7 kg), SpO2 99 %.   General: No apparent distress alert and oriented x3 mood and affect normal, dressed appropriately.  HEENT: Pupils equal, extraocular movements intact  Respiratory: Patient's speak in full sentences and does not appear short of breath  Cardiovascular: No lower extremity edema, non tender, no erythema  Patient does have more tenderness on the left side of the chest musculature.  Seems to be more around the T5-T6 area anteriorly and posteriorly.  Patient is able to take a deep breath no pain down the arm.  The neck does have very mild tightness noted as well.  Osteopathic findings  C2 flexed rotated and side bent right C6 flexed rotated and side bent left T7 extended rotated and side bent left with inhaled. L2 flexed rotated and side bent right Sacrum right on right       Assessment and Plan:  Slipped rib syndrome Patient is having continued some pain syndrome and found seem to be more on the contralateral side.  Discussed icing regimen and home exercises, which activities to doing which wants to avoid.  Increase activity slowly.  Follow-up again in 6 to 8 weeks.   Nonallopathic problems  Decision today to treat  with OMT was based on Physical Exam  After verbal consent patient was treated with HVLA, ME, FPR techniques in cervical, rib, thoracic, lumbar, and sacral  areas  Patient tolerated the procedure well with improvement in symptoms  Patient given exercises, stretches and lifestyle modifications  See medications in patient instructions if given  Patient will follow up in 4-8 weeks      The above documentation has been reviewed and is accurate and complete Lyndal Pulley, DO        Note: This dictation was prepared with Dragon dictation along with smaller phrase technology. Any transcriptional errors that result from this process are unintentional.

## 2021-05-30 ENCOUNTER — Encounter: Payer: Self-pay | Admitting: Family Medicine

## 2021-05-30 ENCOUNTER — Other Ambulatory Visit: Payer: Self-pay

## 2021-05-30 ENCOUNTER — Ambulatory Visit: Payer: Federal, State, Local not specified - PPO | Admitting: Family Medicine

## 2021-05-30 VITALS — BP 102/70 | HR 75 | Ht 66.0 in | Wt 125.0 lb

## 2021-05-30 DIAGNOSIS — M9908 Segmental and somatic dysfunction of rib cage: Secondary | ICD-10-CM | POA: Diagnosis not present

## 2021-05-30 DIAGNOSIS — M940 Chondrocostal junction syndrome [Tietze]: Secondary | ICD-10-CM

## 2021-05-30 DIAGNOSIS — M9903 Segmental and somatic dysfunction of lumbar region: Secondary | ICD-10-CM

## 2021-05-30 DIAGNOSIS — M9902 Segmental and somatic dysfunction of thoracic region: Secondary | ICD-10-CM | POA: Diagnosis not present

## 2021-05-30 DIAGNOSIS — M9904 Segmental and somatic dysfunction of sacral region: Secondary | ICD-10-CM

## 2021-05-30 DIAGNOSIS — M9901 Segmental and somatic dysfunction of cervical region: Secondary | ICD-10-CM

## 2021-05-30 NOTE — Assessment & Plan Note (Signed)
Patient is having continued some pain syndrome and found seem to be more on the contralateral side.  Discussed icing regimen and home exercises, which activities to doing which wants to avoid.  Increase activity slowly.  Follow-up again in 6 to 8 weeks.

## 2021-05-30 NOTE — Patient Instructions (Addendum)
Good to see you  Ice is your friend Watch too much movement.  Happy B-Day  Reschedule the 8/15 appointment I think I am not hear that day but I am around that month. Sorry

## 2021-06-30 ENCOUNTER — Ambulatory Visit: Payer: Federal, State, Local not specified - PPO | Admitting: Family Medicine

## 2021-07-01 DIAGNOSIS — M25374 Other instability, right foot: Secondary | ICD-10-CM | POA: Diagnosis not present

## 2021-07-01 DIAGNOSIS — M25571 Pain in right ankle and joints of right foot: Secondary | ICD-10-CM | POA: Diagnosis not present

## 2021-07-01 DIAGNOSIS — M79671 Pain in right foot: Secondary | ICD-10-CM | POA: Diagnosis not present

## 2021-07-07 NOTE — Progress Notes (Signed)
Allison [name] Phone: [phone] Subjective:   Allison [name], am serving as a scribe for Dr. Hulan Saas.  This visit occurred during the SARS-CoV-2 public health emergency.  Safety protocols were in place, including screening questions prior to the visit, additional usage of staff PPE, and extensive cleaning of exam room while observing appropriate contact time as indicated for disinfecting solutions.    I'm seeing this patient by the request  of:  Inda Coke, [place]  CC: Leg pain, back pain  [id number]  Allison [name] is a 35 y.o. female coming in with complaint of back and neck pain. OMT 05/30/2021. Patient states that she has done something to both ankles. Pain for one week. [name] fracture noted on recent xrays. R>L. Last visit patient had L foot pain but this pain seems to be unrelated. Patient will wake up the following day after wearing less supportive shoes with pain over gastroc insertion.   Patient has had to take Advil regularly for one week due to increase in back pain. Pain in lumbar spine. Has not had to use Flexeril.   Medications patient has been prescribed: Flexeril  Taking:         Reviewed prior external information including notes and imaging from previsou exam, outside providers and external EMR if available.   As well as notes that were available from care everywhere and other healthcare systems.  Past medical history, social, surgical and family history all reviewed in electronic medical record.  [name] unless stated regarding to the chief complaint.   Past Medical History:  Diagnosis Date   Chicken pox    Heart murmur    History of lumbar compression fracture 06/27/2018   Compression fracture L2-L3 Dexa done 07/05/17 report in chart CT lumbar report from 04/22/17 in chart as well as xray from 04/19/17   Lactose intolerance 07/11/2018   Age 35   Osteoporosis,  followed by Dr. Buddy Duty, thought to be due to Accutane 07/27/2017   Done density done 07/05/18 report in chart.    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 POSITIVE muscle aches  Objective  Blood pressure 110/72, pulse 85, height '5\' 6"'$  (1.676 m), weight 129 lb (58.5 kg), SpO2 99 %.   General: [name] apparent distress alert and oriented x3 mood and affect normal, dressed appropriately.  HEENT: Pupils equal, extraocular movements intact  Respiratory: Patient's speak in full sentences and does not appear short of breath  Cardiovascular: [name] lower extremity edema, non tender, [name] erythema  Ankle exams bilaterally show the patient has some mild tightness noted of the posterior cord.  Patient is tender more in the retrocalcaneal area more than the Achilles itself.  Negative Thompson test.  [name] swelling noted.  Limited muscular skeletal ultrasound was performed and interpreted by Hulan Saas, M  Limited ultrasound of patient's ankle shows right ankle has hypoechoic changes in the retrocalcaneal area that is consistent with a resolving bursitis.  Achilles  tendon bilaterally is unremarkable.  Osteopathic findings  C2 flexed rotated and side bent right C6 flexed rotated and side bent left T3 extended rotated and side bent right inhaled rib T9 extended rotated and side bent left L2 flexed rotated and side bent right Sacrum right on right       Assessment and Plan:  Retrocalcaneal  bursitis (back of heel), right Patient has retrocalcaneal bursitis noted on ultrasound likely in the left.  [name] significant erosive changes.  Do not feel that this is anything secondary to some of her potential inflammatory processes.  Discussed heel lifts, home exercises, icing regimen.  Increase activity slowly.  Discussed proper shoes.  Follow-up again in 6 to 8 weeks.   Nonallopathic problems  Decision today to treat with OMT was based on Physical Exam  After verbal consent patient was treated with HVLA, ME, FPR techniques in cervical, rib, thoracic, lumbar, and sacral  areas  Patient tolerated the procedure well with improvement in symptoms  Patient given exercises, stretches and lifestyle modifications  See medications in patient instructions if given  Patient will follow up in 4-8 weeks      The above documentation has been reviewed and is accurate and complete Lyndal Pulley, DO        Note: This dictation was prepared with Dragon dictation along with smaller phrase technology. Any transcriptional errors that result from this process are unintentional.

## 2021-07-08 ENCOUNTER — Other Ambulatory Visit: Payer: Self-pay

## 2021-07-08 ENCOUNTER — Ambulatory Visit: Payer: Self-pay

## 2021-07-08 ENCOUNTER — Ambulatory Visit: Payer: Federal, State, Local not specified - PPO | Admitting: Family Medicine

## 2021-07-08 VITALS — BP 110/72 | HR 85 | Ht 66.0 in | Wt 129.0 lb

## 2021-07-08 DIAGNOSIS — M9901 Segmental and somatic dysfunction of cervical region: Secondary | ICD-10-CM | POA: Diagnosis not present

## 2021-07-08 DIAGNOSIS — M25572 Pain in left ankle and joints of left foot: Secondary | ICD-10-CM | POA: Diagnosis not present

## 2021-07-08 DIAGNOSIS — M9904 Segmental and somatic dysfunction of sacral region: Secondary | ICD-10-CM | POA: Diagnosis not present

## 2021-07-08 DIAGNOSIS — M9908 Segmental and somatic dysfunction of rib cage: Secondary | ICD-10-CM

## 2021-07-08 DIAGNOSIS — M25571 Pain in right ankle and joints of right foot: Secondary | ICD-10-CM

## 2021-07-08 DIAGNOSIS — M9902 Segmental and somatic dysfunction of thoracic region: Secondary | ICD-10-CM

## 2021-07-08 DIAGNOSIS — M7751 Other enthesopathy of right foot: Secondary | ICD-10-CM | POA: Diagnosis not present

## 2021-07-08 DIAGNOSIS — M9903 Segmental and somatic dysfunction of lumbar region: Secondary | ICD-10-CM

## 2021-07-08 NOTE — Assessment & Plan Note (Signed)
Patient has retrocalcaneal bursitis noted on ultrasound likely in the left.  No significant erosive changes.  Do not feel that this is anything secondary to some of her potential inflammatory processes.  Discussed heel lifts, home exercises, icing regimen.  Increase activity slowly.  Discussed proper shoes.  Follow-up again in 6 to 8 weeks.

## 2021-07-08 NOTE — Patient Instructions (Signed)
Heels lifts Voltaren gel Ice 20 min a day OOFOS and HOKA recovery sandals See me in 4-6 weeks

## 2021-07-11 DIAGNOSIS — Z0000 Encounter for general adult medical examination without abnormal findings: Secondary | ICD-10-CM | POA: Diagnosis not present

## 2021-07-11 DIAGNOSIS — L989 Disorder of the skin and subcutaneous tissue, unspecified: Secondary | ICD-10-CM | POA: Diagnosis not present

## 2021-07-22 DIAGNOSIS — D485 Neoplasm of uncertain behavior of skin: Secondary | ICD-10-CM | POA: Diagnosis not present

## 2021-08-07 NOTE — Progress Notes (Signed)
[facility] [place] Phone: [phone] Subjective:   [name], am serving as a scribe for Dr. Hulan Saas. This visit occurred during the SARS-CoV-2 public health emergency.  Safety protocols were in place, including screening questions prior to the visit, additional usage of staff PPE, and extensive cleaning of exam room while observing appropriate contact time as indicated for disinfecting solutions.   I'm seeing this patient by the request  of:  Inda Coke, [place]  CC: Back pain and neck pain follow-up  [id number]  Macall Mccroskey is a 35 y.o. female coming in with complaint of back and neck pain. OMT 07/08/2021. B heel pain. Patient states that her heel pain has improved. Still having pain over medial longitudinal arch.  Very minimal overall.  Nothing significant at this time.  Patient has been wearing the recovery sandals in the house that has been significantly more beneficial.  Tightness in lower back due to sitting more at work.   Medications patient has been prescribed: Flexeril  Taking:         Reviewed prior external information including notes and imaging from previsou exam, outside providers and external EMR if available.   As well as notes that were available from care everywhere and other healthcare systems.  Past medical history, social, surgical and family history all reviewed in electronic medical record.  No pertanent information unless stated regarding to the chief complaint.   Past Medical History:  Diagnosis Date   Chicken pox    Heart murmur    History of lumbar compression fracture 06/27/2018   Compression fracture L2-L3 Dexa done 07/05/17 report in chart CT lumbar report from 04/22/17 in chart as well as xray from 04/19/17   Lactose intolerance 07/11/2018   Age 89   Osteoporosis, followed by Dr. Buddy Duty, thought to be due to Accutane 07/27/2017   Done density done 07/05/18 report in  chart.    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 POSITIVE muscle aches  Objective  Blood pressure 110/84, pulse 84, height 5\' 6"  (1.676 m), weight 127 lb (57.6 kg), SpO2 98 %.   General: No apparent distress alert and oriented x3 mood and affect normal, dressed appropriately.  HEENT: Pupils equal, extraocular movements intact  Respiratory: Patient's speak in full sentences and does not appear short of breath  Cardiovascular: No lower extremity edema, non tender, no erythema  Neck exam does have some mild loss of lordosis.  Some tenderness to palpation in the paraspinal musculature.  Tightness noted in the parascapular region right greater than left.  Osteopathic findings  C2 flexed rotated and side bent right C6 flexed rotated and side bent left T3 extended rotated and side bent right inhaled rib T11 extended rotated and side bent left L2 flexed rotated and side bent right Sacrum right on right   Limited muscular skeletal ultrasound was performed and interpreted by Hulan Saas, M  Limited musculoskeletal ultrasound shows the patient has very mild hypoechoic  area of the posterior tibialis tendon.  Do not see any significant swelling in the retrocalcaneal area. Impression: Interval improvement   Assessment and Plan:  Retrocalcaneal bursitis (back of heel), right Patient likely does not have any significant hypoechoic changes from previous exam at the moment.   Patient is doing much better.  I do expect patient to continue to get better.  Patient will follow up with me again 4 to 6 weeks  Slipped rib syndrome Chronic problem, discussed HEP  RTC in 6 weeks    Nonallopathic problems  Decision today to treat with OMT was based on Physical Exam  After verbal consent patient was treated with HVLA, ME, FPR techniques in cervical, rib, thoracic, lumbar, and sacral  areas  Patient tolerated the procedure well with improvement in symptoms  Patient given exercises, stretches and lifestyle modifications  See medications in patient instructions if given  Patient will follow up in 4-8 weeks     The above documentation has been reviewed and is accurate and complete Lyndal Pulley, DO        Note: This dictation was prepared with Dragon dictation along with smaller phrase technology. Any transcriptional errors that result from this process are unintentional.

## 2021-08-08 ENCOUNTER — Ambulatory Visit: Payer: Federal, State, Local not specified - PPO | Admitting: Family Medicine

## 2021-08-08 ENCOUNTER — Ambulatory Visit: Payer: Self-pay

## 2021-08-08 ENCOUNTER — Encounter: Payer: Self-pay | Admitting: Family Medicine

## 2021-08-08 ENCOUNTER — Other Ambulatory Visit: Payer: Self-pay

## 2021-08-08 VITALS — BP 110/84 | HR 84 | Ht 66.0 in | Wt 127.0 lb

## 2021-08-08 DIAGNOSIS — M9901 Segmental and somatic dysfunction of cervical region: Secondary | ICD-10-CM | POA: Diagnosis not present

## 2021-08-08 DIAGNOSIS — M9902 Segmental and somatic dysfunction of thoracic region: Secondary | ICD-10-CM | POA: Diagnosis not present

## 2021-08-08 DIAGNOSIS — M9904 Segmental and somatic dysfunction of sacral region: Secondary | ICD-10-CM

## 2021-08-08 DIAGNOSIS — M9903 Segmental and somatic dysfunction of lumbar region: Secondary | ICD-10-CM

## 2021-08-08 DIAGNOSIS — M7751 Other enthesopathy of right foot: Secondary | ICD-10-CM

## 2021-08-08 DIAGNOSIS — M940 Chondrocostal junction syndrome [Tietze]: Secondary | ICD-10-CM

## 2021-08-08 DIAGNOSIS — M9908 Segmental and somatic dysfunction of rib cage: Secondary | ICD-10-CM

## 2021-08-08 NOTE — Patient Instructions (Signed)
See me in 4-6 weeks

## 2021-08-08 NOTE — Assessment & Plan Note (Signed)
Patient likely does not have any significant hypoechoic changes from previous exam at the moment.  Patient is doing much better.  I do expect patient to continue to get better.  Patient will follow up with me again 4 to 6 weeks

## 2021-08-08 NOTE — Assessment & Plan Note (Signed)
Chronic problem, discussed HEP  RTC in 6 weeks

## 2021-09-08 ENCOUNTER — Ambulatory Visit: Payer: Federal, State, Local not specified - PPO | Admitting: Family Medicine

## 2021-09-08 DIAGNOSIS — M818 Other osteoporosis without current pathological fracture: Secondary | ICD-10-CM | POA: Diagnosis not present

## 2021-09-08 DIAGNOSIS — Z01419 Encounter for gynecological examination (general) (routine) without abnormal findings: Secondary | ICD-10-CM | POA: Diagnosis not present

## 2021-09-08 DIAGNOSIS — Z6821 Body mass index (BMI) 21.0-21.9, adult: Secondary | ICD-10-CM | POA: Diagnosis not present

## 2021-09-09 ENCOUNTER — Ambulatory Visit: Payer: Federal, State, Local not specified - PPO | Admitting: Sports Medicine

## 2021-09-09 ENCOUNTER — Other Ambulatory Visit: Payer: Self-pay

## 2021-09-09 VITALS — BP 112/70 | HR 78 | Ht 66.0 in | Wt 130.0 lb

## 2021-09-09 DIAGNOSIS — M9902 Segmental and somatic dysfunction of thoracic region: Secondary | ICD-10-CM | POA: Diagnosis not present

## 2021-09-09 DIAGNOSIS — M9903 Segmental and somatic dysfunction of lumbar region: Secondary | ICD-10-CM | POA: Diagnosis not present

## 2021-09-09 DIAGNOSIS — M9905 Segmental and somatic dysfunction of pelvic region: Secondary | ICD-10-CM

## 2021-09-09 DIAGNOSIS — M546 Pain in thoracic spine: Secondary | ICD-10-CM

## 2021-09-09 DIAGNOSIS — M9901 Segmental and somatic dysfunction of cervical region: Secondary | ICD-10-CM

## 2021-09-09 DIAGNOSIS — M9908 Segmental and somatic dysfunction of rib cage: Secondary | ICD-10-CM

## 2021-09-09 NOTE — Progress Notes (Signed)
   Allison [name] D.[place] Phone: [phone]   Assessment and Plan:     1. Acute left-sided thoracic back pain 2. Somatic dysfunction of cervical region 3. Somatic dysfunction of thoracic region 4. Somatic dysfunction of lumbar region 5. Somatic dysfunction of pelvic region 6. Somatic dysfunction of rib region -Chronic with exacerbation, subsequent sports medicine visit - Multiple chronic pains that are generally well-maintained with OMT, with more acute left-sided thoracic pain after traveling long distance and airplane - Patient elects for OMT.  Tolerated well per note below   Decision today to treat with OMT was based on Physical Exam   After verbal consent patient was treated with HVLA (high velocity low amplitude), ME (muscle energy), FPR (flex positional release), ST (soft tissue), PC/PD (Pelvic Compression/ Pelvic Decompression) techniques in cervical, rib, thoracic, lumbar, and pelvic areas.  HVLA was avoided in lumbar spine due to history of lumbar compression fracture.  Patient tolerated the procedure well with improvement in symptoms.  Patient educated on potential side effects of soreness and recommended to rest, hydrate, and use Tylenol as needed for pain control.   Pertinent previous records reviewed include previous sports med notes   Follow Up: In 4 to 6 weeks for repeat OMT   Subjective:   I, Judy Pimple, am serving as a scribe for Dr. Glennon Mac  Chief Complaint: Neck and back pain  HPI:   09/09/21 Patient is a 35 year old female presenting with neck and back pain. Patient was last seen by Dr. Tamala Julian on 08/08/21 and had OMT. Today patient states that her upper right back has been flared up after traveling in a plane.   Relevant Historical Information: History of lumbar compression fracture seen on DEXA from 07/05/2017, osteoporosis  Additional pertinent review of systems negative.  Current  Outpatient Medications  Medication Sig Dispense Refill   Ascorbic Acid (VITAMIN C) 500 MG CAPS Take 1 capsule by mouth daily.     cholecalciferol (VITAMIN D) 1000 units tablet Take 2,000 Units by mouth daily.     cyclobenzaprine (FLEXERIL) 5 MG tablet Take 1 tablet (5 mg total) by mouth 3 (three) times daily as needed for muscle spasms. 30 tablet 0   Polysaccharide Iron Complex (NU-IRON PO) Take 1 tablet by mouth daily.      No current facility-administered medications for this visit.      Objective:     Vitals:   09/09/21 1001  BP: 112/70  Pulse: 78  SpO2: 99%  Weight: 130 lb (59 kg)  Height: 5\' 6"  (1.676 m)      Body mass index is 20.98 kg/m.    Physical Exam:     General: Well-appearing, cooperative, sitting comfortably in no acute distress.   OMT Physical Exam:  ASIS Compression Test: Positive Right Cervical: TTP paraspinal, C3 RRSR Rib: Bilateral elevated first rib with TTP Thoracic: TTP paraspinal, T4-8 RLSR Lumbar: TTP paraspinal, L1-3 RRSL Pelvis: Right anterior innominate without flare  Electronically signed by:  Allison [name] D.[facility] 10:33 AM 09/09/21

## 2021-09-09 NOTE — Patient Instructions (Addendum)
Good to see you  4 weeks follow up OMT

## 2021-09-16 DIAGNOSIS — C44519 Basal cell carcinoma of skin of other part of trunk: Secondary | ICD-10-CM | POA: Diagnosis not present

## 2021-10-17 DIAGNOSIS — Z13820 Encounter for screening for osteoporosis: Secondary | ICD-10-CM | POA: Diagnosis not present

## 2021-10-17 DIAGNOSIS — M8589 Other specified disorders of bone density and structure, multiple sites: Secondary | ICD-10-CM | POA: Diagnosis not present

## 2021-10-17 DIAGNOSIS — M859 Disorder of bone density and structure, unspecified: Secondary | ICD-10-CM | POA: Diagnosis not present

## 2021-10-23 NOTE — Progress Notes (Signed)
Allison [name] Phone: [phone] Subjective:   Allison [name], am serving as a scribe for Dr. Hulan Saas.  This visit occurred during the SARS-CoV-2 public health emergency.  Safety protocols were in place, including screening questions prior to the visit, additional usage of staff PPE, and extensive cleaning of exam room while observing appropriate contact time as indicated for disinfecting solutions.    I'm seeing this patient by the request  of:  Inda Coke, [place]  CC: Back and neck pain follow-up  [id number]  Allison [name] is a 35 y.o. female coming in with complaint of back and neck pain. OMT 08/08/2021. Patient states that she has tightness in neck and L glute.   Medications patient has been prescribed: Flexeril          Reviewed prior external information including notes and imaging from previsou exam, outside providers and external EMR if available.   As well as notes that were available from care everywhere and other healthcare systems.  Past medical history, social, surgical and family history all reviewed in electronic medical record.  [name] unless stated regarding to the chief complaint.   Past Medical History:  Diagnosis Date   Chicken pox    Heart murmur    History of lumbar compression fracture 06/27/2018   Compression fracture L2-L3 Dexa done 07/05/17 report in chart CT lumbar report from 04/22/17 in chart as well as xray from 04/19/17   Lactose intolerance 07/11/2018   Age 5   Osteoporosis, followed by Dr. Buddy Duty, thought to be due to Accutane 07/27/2017   Done density done 07/05/18 report in chart.    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 POSITIVE muscle aches  Objective  Blood pressure 102/74, pulse 99, height 5\' 6"  (1.676 m), weight 131 lb (59.4 kg), SpO2 99 %.   General: [name] apparent distress alert and oriented x3 mood and affect normal, dressed appropriately.  HEENT: Pupils equal, extraocular movements intact  Respiratory: Patient's speak in full sentences and does not appear short of breath  Cardiovascular: [name] lower extremity edema, non tender, [name] erythema  Back exam does have some loss of lordosis.  Patient does have hypermobility noted to some other joints.  Patient does have tightness noted of the parascapular region right greater than left.  Tightness of the right sacroiliac joint noted today as well.  Osteopathic findings  C2 flexed rotated and side bent right C6 flexed rotated and side bent left T3 extended rotated and side bent right inhaled rib T9 extended rotated and side bent left L2 flexed rotated and side bent right Sacrum right on right Pelvic shear noted right      Assessment and Plan:  SI (sacroiliac) joint dysfunction Increased.  Has been sometime since we have seen patient.  Patient does have the Flexeril noted.  Responds well to osteopathic manipulation.  Discussed with him continuing with Cymbalta.  Follow-up with me again in 6 to  8 weeks   Nonallopathic problems  Decision today to treat with OMT was based on Physical Exam  After verbal consent patient was treated with HVLA, ME, FPR techniques in cervical, rib, thoracic, lumbar, and sacral  areas  Patient tolerated the procedure well with improvement in symptoms  Patient given exercises, stretches and lifestyle modifications  See  medications in patient instructions if given  Patient will follow up in 4-8 weeks      The above documentation has been reviewed and is accurate and complete Lyndal Pulley, DO        Note: This dictation was prepared with Dragon dictation along with smaller phrase technology. Any transcriptional errors that result from this process are unintentional.

## 2021-10-28 ENCOUNTER — Ambulatory Visit: Payer: Federal, State, Local not specified - PPO | Admitting: Family Medicine

## 2021-10-28 ENCOUNTER — Other Ambulatory Visit: Payer: Self-pay

## 2021-10-28 VITALS — BP 102/74 | HR 99 | Ht 66.0 in | Wt 131.0 lb

## 2021-10-28 DIAGNOSIS — M9908 Segmental and somatic dysfunction of rib cage: Secondary | ICD-10-CM

## 2021-10-28 DIAGNOSIS — M9904 Segmental and somatic dysfunction of sacral region: Secondary | ICD-10-CM

## 2021-10-28 DIAGNOSIS — M9902 Segmental and somatic dysfunction of thoracic region: Secondary | ICD-10-CM | POA: Diagnosis not present

## 2021-10-28 DIAGNOSIS — M9903 Segmental and somatic dysfunction of lumbar region: Secondary | ICD-10-CM | POA: Diagnosis not present

## 2021-10-28 DIAGNOSIS — M533 Sacrococcygeal disorders, not elsewhere classified: Secondary | ICD-10-CM

## 2021-10-28 DIAGNOSIS — M9905 Segmental and somatic dysfunction of pelvic region: Secondary | ICD-10-CM | POA: Diagnosis not present

## 2021-10-28 DIAGNOSIS — M9901 Segmental and somatic dysfunction of cervical region: Secondary | ICD-10-CM | POA: Diagnosis not present

## 2021-10-28 NOTE — Patient Instructions (Signed)
Good to see you! [place] I'll check with Catalina [place] See you again in 2 months

## 2021-10-28 NOTE — Assessment & Plan Note (Signed)
Increased.  Has been sometime since we have seen patient.  Patient does have the Flexeril noted.  Responds well to osteopathic manipulation.  Discussed with him continuing with Cymbalta.  Follow-up with me again in 6 to 8 weeks

## 2021-11-05 DIAGNOSIS — R82994 Hypercalciuria: Secondary | ICD-10-CM | POA: Diagnosis not present

## 2021-11-05 DIAGNOSIS — Z6821 Body mass index (BMI) 21.0-21.9, adult: Secondary | ICD-10-CM | POA: Diagnosis not present

## 2021-11-05 DIAGNOSIS — M4846XD Fatigue fracture of vertebra, lumbar region, subsequent encounter for fracture with routine healing: Secondary | ICD-10-CM | POA: Diagnosis not present

## 2021-11-05 DIAGNOSIS — M859 Disorder of bone density and structure, unspecified: Secondary | ICD-10-CM | POA: Diagnosis not present

## 2021-11-14 DIAGNOSIS — R82994 Hypercalciuria: Secondary | ICD-10-CM | POA: Diagnosis not present

## 2021-12-16 NOTE — Progress Notes (Signed)
[place] Phone: [phone] Subjective:   Allison [name], am serving as a scribe for Dr. Hulan Saas. This visit occurred during the SARS-CoV-2 public health emergency.  Safety protocols were in place, including screening questions prior to the visit, additional usage of staff PPE, and extensive cleaning of exam room while observing appropriate contact time as indicated for disinfecting solutions.   I'm seeing this patient by the request  of:  Inda Coke, [place]  CC: Neck and back pain follow-up  [id number]  Allison [name] is a 36 y.o. female coming in with complaint of back and neck pain. OMT 10/28/2021. Patient states overall doing relatively well.  Was on vacation and feels like that did help out significantly.  Patient is still having some mild tightness but nothing severe.  Just got back yesterday.  Medications patient has been prescribed: None  Taking:         Reviewed prior external information including notes and imaging from previsou exam, outside providers and external EMR if available.   As well as notes that were available from care everywhere and other healthcare systems.  Past medical history, social, surgical and family history all reviewed in electronic medical record.  [name] unless stated regarding to the chief complaint.   Past Medical History:  Diagnosis Date   Chicken pox    Heart murmur    History of lumbar compression fracture 06/27/2018   Compression fracture L2-L3 Dexa done 07/05/17 report in chart CT lumbar report from 04/22/17 in chart as well as xray from 04/19/17   Lactose intolerance 07/11/2018   Age 44   Osteoporosis, followed by Dr. Buddy Duty, thought to be due to Accutane 07/27/2017   Done density done 07/05/18 report in chart.    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 POSITIVE muscle aches  Objective  Blood pressure 92/66, pulse 88, height 5\' 6"  (1.676 m), weight 133 lb (60.3 kg), SpO2 99 %.   General: [name] apparent distress alert and oriented x3 mood and affect normal, dressed appropriately.  HEENT: Pupils equal, extraocular movements intact  Respiratory: Patient's speak in full sentences and does not appear short of breath  Cardiovascular: [name] lower extremity edema, non tender, [name] erythema  Patient does have some mild loss of lordosis.  Patient does have tightness still noted in the right parascapular area.  Patient does have tightness of the neck on the right side as well.  5 out of 5 strength of the upper extremities.  Some mild tightness noted in the paraspinal musculature of the right lower back.  Osteopathic findings C6 flexed rotated and side bent left T3 extended rotated and side bent right inhaled rib T6 extended rotated and side bent left inhaled rib L1 flexed rotated and side bent right Sacrum right on right       Assessment and Plan:  SI (sacroiliac) joint dysfunction Chronic problem but overall relatively stable.  Patient did have some  mild exacerbation noted.  Patient does have some tenderness to palpation over the paraspinal musculature.    Nonallopathic problems  Decision today to treat with OMT was based on Physical Exam  After verbal consent patient was treated with HVLA, ME, FPR techniques in cervical, rib, thoracic, lumbar, and sacral  areas  Patient tolerated the procedure well with  improvement in symptoms  Patient given exercises, stretches and lifestyle modifications  See medications in patient instructions if given  Patient will follow up in 4-8 weeks     The above documentation has been reviewed and is accurate and complete Allison Pulley, DO         Note: This dictation was prepared with Dragon dictation along with smaller phrase technology. Any transcriptional errors that result from this process are unintentional.

## 2021-12-17 ENCOUNTER — Other Ambulatory Visit: Payer: Self-pay

## 2021-12-17 ENCOUNTER — Ambulatory Visit: Payer: Federal, State, Local not specified - PPO | Admitting: Family Medicine

## 2021-12-17 VITALS — BP 92/66 | HR 88 | Ht 66.0 in | Wt 133.0 lb

## 2021-12-17 DIAGNOSIS — M9903 Segmental and somatic dysfunction of lumbar region: Secondary | ICD-10-CM

## 2021-12-17 DIAGNOSIS — M9904 Segmental and somatic dysfunction of sacral region: Secondary | ICD-10-CM | POA: Diagnosis not present

## 2021-12-17 DIAGNOSIS — M533 Sacrococcygeal disorders, not elsewhere classified: Secondary | ICD-10-CM | POA: Diagnosis not present

## 2021-12-17 DIAGNOSIS — M9902 Segmental and somatic dysfunction of thoracic region: Secondary | ICD-10-CM

## 2021-12-17 DIAGNOSIS — M9901 Segmental and somatic dysfunction of cervical region: Secondary | ICD-10-CM

## 2021-12-17 DIAGNOSIS — M9908 Segmental and somatic dysfunction of rib cage: Secondary | ICD-10-CM | POA: Diagnosis not present

## 2021-12-17 NOTE — Patient Instructions (Signed)
Great to see you See me in 6 weeks

## 2021-12-17 NOTE — Assessment & Plan Note (Addendum)
Chronic problem but overall relatively stable.  Patient did have some mild exacerbation noted.  Patient does have some tenderness to palpation over the paraspinal musculature.

## 2022-01-13 ENCOUNTER — Encounter: Payer: Self-pay | Admitting: Family Medicine

## 2022-01-13 MED ORDER — PREDNISONE 20 MG PO TABS: 40.0000 mg | ORAL_TABLET | Freq: Every day | ORAL | 0 refills | Status: DC

## 2022-02-06 NOTE — Progress Notes (Signed)
?Charlann Boxer D.O. ?[facility] ?[place] ?Phone: [phone] ?Subjective:   ?I, Allison [name], am serving as a Education administrator for Dr. Hulan Saas. ?This visit occurred during the SARS-CoV-2 public health emergency.  Safety protocols were in place, including screening questions prior to the visit, additional usage of staff PPE, and extensive cleaning of exam room while observing appropriate contact time as indicated for disinfecting solutions.  ? ?I'm seeing this patient by the request  of:  Inda Coke, [place] ? ?CC: neck and back pain  ? ?[id number]  ?Allison [name] is a 36 y.o. female coming in with complaint of back and neck pain. OMT 12/17/2021. Also f/u for B heel pain, L>R. Patient states that she tried prednisone for her heel pain since last visit. Pain did lessen but is still present.  ? ?Back pain has been manageable.  ? ?Medications patient has been prescribed: Prednisone ? ?Taking: ? ? ?  ? ? ? ? ?Reviewed prior external information including notes and imaging from previsou exam, outside providers and external EMR if available.  ? ?As well as notes that were available from care everywhere and other healthcare systems. ? ?Past medical history, social, surgical and family history all reviewed in electronic medical record.  No pertanent information unless stated regarding to the chief complaint.  ? ?Past Medical History:  ?Diagnosis Date  ? Chicken pox   ? Heart murmur   ? History of lumbar compression fracture 06/27/2018  ? Compression fracture L2-L3 Dexa done 07/05/17 report in chart CT lumbar report from 04/22/17 in chart as well as xray from 04/19/17  ? Lactose intolerance 07/11/2018  ? Age 53  ? Osteoporosis, followed by Dr. Buddy Duty, thought to be due to Accutane 07/27/2017  ? Done density done 07/05/18 report in chart.   ? Psoriasis 07/11/2018  ? 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 Right-sided injection 08/09/2017 Left-sided injected September 27, 2017  ?  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  ?Allergies  ?Allergen Reactions  ? Morphine Rash  ?  Age 53 said felt like skin burning tolerated hydrocodone in past   ? ? ? ?Review of Systems: ? No headache, visual changes, nausea, vomiting, diarrhea, constipation, dizziness, abdominal pain, skin rash, fevers, chills, night sweats, weight loss, swollen lymph nodes, body aches, joint swelling, chest pain, shortness of breath, mood changes. POSITIVE muscle aches ? ?Objective  ?Blood pressure 104/72, pulse 85, height '5\' 6"'$  (1.676 m), weight 131 lb (59.4 kg), SpO2 98 %. ?  ?General: No apparent distress alert and oriented x3 mood and affect normal, dressed appropriately.  ?HEENT: Pupils equal, extraocular movements intact  ?Respiratory: Patient's speak in full sentences and does not appear short of breath  ?Cardiovascular: Likely no edema noted today. ?Ankle exam shows the patient has good range of motion.  No swelling.  No tenderness noted on exam today. ? ?Limited muscular skeletal ultrasound was performed and interpreted by Hulan Saas, M  ?Limited ultrasound of patient's ankles bilaterally show no significant joint effusion.  No calcific changes noted in the Achilles or the posterior tibialis.  No hypoechoic changes. ?Impression: Normal ultrasound of patient's ankles bilaterally ? ? ?Osteopathic findings ? ?C2 flexed rotated and side bent right ?C7 flexed rotated and side bent left ?T3 extended rotated and side bent right inhaled rib ?T9 extended rotated and side bent left ?L2 flexed rotated and side bent right ?Sacrum right on right ? ? ? ? ?  ?Assessment and Plan: ? ?Lower  extremity edema ?Patient has had this intermittently a couple times at this point.  The patient is a low risk when it comes to lymphedema.  Seems to be worse when she is doing more standing on a longer amount of time and seems to be somewhat associated with alcohol intake.  Discussed with patient about compression sleeves, icing regimen, home exercises.   Need to monitor her diet to see if anything else could be potentially contributing.  Follow-up with me again weeks otherwise.  Due to female with patient's recent diagnosis of melanoma she has history of osteoporosis possible genetic counseling could be beneficial as well. ? ?SI (sacroiliac) joint dysfunction ?Chronic problem with exacerbation.  Discussed icing regimen and home exercises, discussed which activities to do much.  Advised to increase activity slowly.  Follow-up again in 6 to 8 weeks. ?  ? ?Nonallopathic problems ? ?Decision today to treat with OMT was based on Physical Exam ? ?After verbal consent patient was treated with HVLA, ME, FPR techniques in cervical, rib, thoracic, lumbar, and sacral  areas ? ?Patient tolerated the procedure well with improvement in symptoms ? ?Patient given exercises, stretches and lifestyle modifications ? ?See medications in patient instructions if given ? ?Patient will follow up in 4-8 weeks ? ?  ? ? ?The above documentation has been reviewed and is accurate and complete Lyndal Pulley, DO ? ? ? ?  ? ? Note: This dictation was prepared with Dragon dictation along with smaller phrase technology. Any transcriptional errors that result from this process are unintentional.    ?  ?  ? ?

## 2022-02-09 ENCOUNTER — Ambulatory Visit: Payer: Self-pay

## 2022-02-09 ENCOUNTER — Other Ambulatory Visit: Payer: Self-pay

## 2022-02-09 ENCOUNTER — Ambulatory Visit: Payer: Federal, State, Local not specified - PPO | Admitting: Family Medicine

## 2022-02-09 VITALS — BP 104/72 | HR 85 | Ht 66.0 in | Wt 131.0 lb

## 2022-02-09 DIAGNOSIS — M79671 Pain in right foot: Secondary | ICD-10-CM | POA: Diagnosis not present

## 2022-02-09 DIAGNOSIS — M9908 Segmental and somatic dysfunction of rib cage: Secondary | ICD-10-CM

## 2022-02-09 DIAGNOSIS — M9904 Segmental and somatic dysfunction of sacral region: Secondary | ICD-10-CM

## 2022-02-09 DIAGNOSIS — M9901 Segmental and somatic dysfunction of cervical region: Secondary | ICD-10-CM | POA: Diagnosis not present

## 2022-02-09 DIAGNOSIS — M810 Age-related osteoporosis without current pathological fracture: Secondary | ICD-10-CM

## 2022-02-09 DIAGNOSIS — M9903 Segmental and somatic dysfunction of lumbar region: Secondary | ICD-10-CM

## 2022-02-09 DIAGNOSIS — M9902 Segmental and somatic dysfunction of thoracic region: Secondary | ICD-10-CM

## 2022-02-09 DIAGNOSIS — M533 Sacrococcygeal disorders, not elsewhere classified: Secondary | ICD-10-CM

## 2022-02-09 DIAGNOSIS — M79672 Pain in left foot: Secondary | ICD-10-CM

## 2022-02-09 DIAGNOSIS — R600 Localized edema: Secondary | ICD-10-CM | POA: Insufficient documentation

## 2022-02-09 DIAGNOSIS — I890 Lymphedema, not elsewhere classified: Secondary | ICD-10-CM | POA: Diagnosis not present

## 2022-02-09 DIAGNOSIS — C439 Malignant melanoma of skin, unspecified: Secondary | ICD-10-CM

## 2022-02-09 NOTE — Assessment & Plan Note (Signed)
Chronic problem with exacerbation.  Discussed icing regimen and home exercises, discussed which activities to do much.  Advised to increase activity slowly.  Follow-up again in 6 to 8 weeks. ?

## 2022-02-09 NOTE — Patient Instructions (Signed)
Good to see you ?Double blind study with high noon alcohol at home ?Read about lymphedema precox ?Compression socks 2 days after tail gaiting ?Referral to genetics-[facility] ?See me 6 weeks ? ?

## 2022-02-09 NOTE — Assessment & Plan Note (Signed)
Patient has had this intermittently a couple times at this point.  The patient is a low risk when it comes to lymphedema.  Seems to be worse when she is doing more standing on a longer amount of time and seems to be somewhat associated with alcohol intake.  Discussed with patient about compression sleeves, icing regimen, home exercises.  Need to monitor her diet to see if anything else could be potentially contributing.  Follow-up with me again weeks otherwise.  Due to female with patient's recent diagnosis of melanoma she has history of osteoporosis possible genetic counseling could be beneficial as well. ?

## 2022-02-11 ENCOUNTER — Ambulatory Visit: Payer: Federal, State, Local not specified - PPO | Admitting: Family Medicine

## 2022-02-25 ENCOUNTER — Encounter: Payer: Self-pay | Admitting: Family Medicine

## 2022-02-25 NOTE — Progress Notes (Signed)
?Charlann Boxer D.O. ?[facility] ?[place] ?Phone: [phone] ?Subjective:   ?I, Allison [name], am serving as a scribe for Dr. Hulan Saas. ? ?This visit occurred during the SARS-CoV-2 public health emergency.  Safety protocols were in place, including screening questions prior to the visit, additional usage of staff PPE, and extensive cleaning of exam room while observing appropriate contact time as indicated for disinfecting solutions.  ? ? ?I'm seeing this patient by the request  of:  Allison [name], [place] ? ?CC: Neck and back pain follow-up ? ?[id number]  ?Allison [name] is a 36 y.o. female coming in with complaint of back and neck pain. OMT 02/09/2022. Pain increasing in upper back. Patient states that she is having L upper back pain in scapula and trap for past week. Notices pain in L hand with holding lense up to patients eyes with shoulder flexed and elbow bent at 90 degrees. Also has pain with elbow fully extended. Pain mostly in L thumb. Denies any numbness or tingling.  ? ?Medications patient has been prescribed: None ? ?Taking: ? ? ?  ? ? ? ? ?Reviewed prior external information including notes and imaging from previsou exam, outside providers and external EMR if available.  ? ?As well as notes that were available from care everywhere and other healthcare systems. ? ?Past medical history, social, surgical and family history all reviewed in electronic medical record.  No pertanent information unless stated regarding to the chief complaint.  ? ?Past Medical History:  ?Diagnosis Date  ? Chicken pox   ? Heart murmur   ? History of lumbar compression fracture 06/27/2018  ? Compression fracture L2-L3 Dexa done 07/05/17 report in chart CT lumbar report from 04/22/17 in chart as well as xray from 04/19/17  ? Lactose intolerance 07/11/2018  ? Age 76  ? Osteoporosis, followed by Dr. Buddy Duty, thought to be due to Accutane 07/27/2017  ? Done density done 07/05/18 report in  chart.   ? Psoriasis 07/11/2018  ? 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 Right-sided injection 08/09/2017 Left-sided injected September 27, 2017  ? 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  ?Allergies  ?Allergen Reactions  ? Morphine Rash  ?  Age 76 said felt like skin burning tolerated hydrocodone in past   ? ? ? ?Review of Systems: ? No headache, visual changes, nausea, vomiting, diarrhea, constipation, dizziness, abdominal pain, skin rash, fevers, chills, night sweats, weight loss, swollen lymph nodes, body aches, joint swelling, chest pain, shortness of breath, mood changes. POSITIVE muscle aches ? ?Objective  ?Blood pressure 118/84, pulse 75, height '5\' 6"'$  (1.676 m), SpO2 98 %. ?  ?General: No apparent distress alert and oriented x3 mood and affect normal, dressed appropriately.  ?HEENT: Pupils equal, extraocular movements intact  ?Respiratory: Patient's speak in full sentences and does not appear short of breath  ?Cardiovascular: No lower extremity edema, non tender, no erythema  ?Neck exam does have some loss of lordosis.  Patient does have more tightness on the left side of the neck than the right some limited sidebending bilaterally. ?Hand exam shows no significant weakness.  No significant weakness or pain over the thumb ? ?Osteopathic findings ? ?C2 flexed rotated and side bent right ?C6 flexed rotated and side bent left ?T3 extended rotated and side bent left inhaled rib ?L2 flexed rotated and side bent right ?Sacrum right on right ? ? ? ? ?  ?Assessment and Plan: ? ?SI (sacroiliac) joint dysfunction ?Chronic problem but  overall relatively stable.  Did have some increasing nausea and discussed with patient icing regimen and home exercises, discussed which activities to do which ones to avoid.  Not taking any medications on a regular basis for this problem.  Follow-up again in 6 to 8 weeks otherwise.  ? ?Nonallopathic problems ? ?Decision today to treat with OMT was based on Physical Exam ? ?After verbal  consent patient was treated with HVLA, ME, FPR techniques in cervical, rib, thoracic, lumbar, and sacral  areas ? ?Patient tolerated the procedure well with improvement in symptoms ? ?Patient given exercises, stretches and lifestyle modifications ? ?See medications in patient instructions if given ? ?Patient will follow up in 4-8 weeks ? ?  ? ?The above documentation has been reviewed and is accurate and complete Lyndal Pulley, DO ? ? ? ?  ? ? Note: This dictation was prepared with Dragon dictation along with smaller phrase technology. Any transcriptional errors that result from this process are unintentional.    ?  ?  ? ?

## 2022-03-03 ENCOUNTER — Encounter: Payer: Self-pay | Admitting: Family Medicine

## 2022-03-03 ENCOUNTER — Ambulatory Visit: Payer: Federal, State, Local not specified - PPO | Admitting: Family Medicine

## 2022-03-03 ENCOUNTER — Ambulatory Visit: Payer: Self-pay

## 2022-03-03 VITALS — BP 118/84 | HR 75 | Ht 66.0 in

## 2022-03-03 DIAGNOSIS — M9902 Segmental and somatic dysfunction of thoracic region: Secondary | ICD-10-CM | POA: Diagnosis not present

## 2022-03-03 DIAGNOSIS — M9904 Segmental and somatic dysfunction of sacral region: Secondary | ICD-10-CM | POA: Diagnosis not present

## 2022-03-03 DIAGNOSIS — M79645 Pain in left finger(s): Secondary | ICD-10-CM | POA: Diagnosis not present

## 2022-03-03 DIAGNOSIS — M9903 Segmental and somatic dysfunction of lumbar region: Secondary | ICD-10-CM

## 2022-03-03 DIAGNOSIS — M533 Sacrococcygeal disorders, not elsewhere classified: Secondary | ICD-10-CM

## 2022-03-03 DIAGNOSIS — M9908 Segmental and somatic dysfunction of rib cage: Secondary | ICD-10-CM

## 2022-03-03 DIAGNOSIS — M9901 Segmental and somatic dysfunction of cervical region: Secondary | ICD-10-CM

## 2022-03-03 NOTE — Assessment & Plan Note (Signed)
Chronic problem but overall relatively stable.  Did have some increasing nausea and discussed with patient icing regimen and home exercises, discussed which activities to do which ones to avoid.  Not taking any medications on a regular basis for this problem.  Follow-up again in 6 to 8 weeks otherwise. ?

## 2022-03-03 NOTE — Patient Instructions (Signed)
Good to see you ?Work on Personal assistant ?See me again 6-8 weeks ?

## 2022-03-31 ENCOUNTER — Ambulatory Visit: Payer: Federal, State, Local not specified - PPO | Admitting: Family Medicine

## 2022-04-09 NOTE — Progress Notes (Signed)
[place] Phone: [phone] Subjective:   Allison [name], am serving as a scribe for Dr. Hulan Saas.   I'm seeing this patient by the request  of:  Allison Lynn, MD  CC: back and neck pain follow up   [id number]  Allison [name] is a 36 y.o. female coming in with complaint of back and neck pain. OMT 03/03/2022. Patient states that her L hip and R cervical spine seem more tight than usual.  Patient states nothing new recently.  Reviewed outside notes and patient has had genetic testing done for the melanoma.  Results still should not be back for another 3 weeks.  Medications patient has been prescribed: None  Taking:         Reviewed prior external information including notes and imaging from previsou exam, outside providers and external EMR if available.   As well as notes that were available from care everywhere and other healthcare systems.  Past medical history, social, surgical and family history all reviewed in electronic medical record.  [name] unless stated regarding to the chief complaint.   Past Medical History:  Diagnosis Date   Chicken pox    Heart murmur    History of lumbar compression fracture 06/27/2018   Compression fracture L2-L3 Dexa done 07/05/17 report in chart CT lumbar report from 04/22/17 in chart as well as xray from 04/19/17   Lactose intolerance 07/11/2018   Age 42   Osteoporosis, followed by Dr. Buddy Duty, thought to be due to Accutane 07/27/2017   Done density done 07/05/18 report in chart.    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 POSITIVE muscle aches  Objective  Blood pressure 118/84, pulse 69, height '5\' 6"'$  (1.676 m), weight 134 lb (60.8 kg), SpO2 99 %.   General: [name] apparent distress alert and oriented x3 mood and affect normal, dressed appropriately.  HEENT: Pupils equal, extraocular movements intact  Respiratory: Patient's speak in full sentences and does not appear short of breath  Cardiovascular: [name] lower extremity edema, non tender, [name] erythema  Patient's tach does have some mild limited range of motion.  Tenderness to palpation of the paraspinal musculature.  Patient does have mild tightness noted with FABER test right greater than left.  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  Still responding extremely well to osteopathic manipulation.  Discussed with patient medicine regimen and home exercises.  Has had Flexeril for any type of breakthrough pain.  Not taking anti-inflammatories regularly.  Seems to do well with 6 to 8-week intervals.   Nonallopathic problems  Decision today to treat  with OMT was based on Physical Exam  After verbal consent patient was treated with HVLA, ME, FPR techniques in cervical, rib, thoracic, lumbar, and sacral and pelvis areas  Patient tolerated the procedure well with improvement in symptoms  Patient given exercises, stretches and lifestyle modifications  See medications in patient instructions if given  Patient will follow up  in 4-8 weeks      The above documentation has been reviewed and is accurate and complete Lyndal Pulley, DO        Note: This dictation was prepared with Dragon dictation along with smaller phrase technology. Any transcriptional errors that result from this process are unintentional.

## 2022-04-14 ENCOUNTER — Ambulatory Visit: Payer: Federal, State, Local not specified - PPO | Admitting: Family Medicine

## 2022-04-14 VITALS — BP 118/84 | HR 69 | Ht 66.0 in | Wt 134.0 lb

## 2022-04-14 DIAGNOSIS — M9908 Segmental and somatic dysfunction of rib cage: Secondary | ICD-10-CM

## 2022-04-14 DIAGNOSIS — M9903 Segmental and somatic dysfunction of lumbar region: Secondary | ICD-10-CM | POA: Diagnosis not present

## 2022-04-14 DIAGNOSIS — M9905 Segmental and somatic dysfunction of pelvic region: Secondary | ICD-10-CM

## 2022-04-14 DIAGNOSIS — M940 Chondrocostal junction syndrome [Tietze]: Secondary | ICD-10-CM | POA: Diagnosis not present

## 2022-04-14 DIAGNOSIS — M9901 Segmental and somatic dysfunction of cervical region: Secondary | ICD-10-CM | POA: Diagnosis not present

## 2022-04-14 DIAGNOSIS — M9904 Segmental and somatic dysfunction of sacral region: Secondary | ICD-10-CM

## 2022-04-14 DIAGNOSIS — M9902 Segmental and somatic dysfunction of thoracic region: Secondary | ICD-10-CM

## 2022-04-14 NOTE — Assessment & Plan Note (Signed)
Continues to have right-sided cervical pain and sleeping syndrome.  Still responding extremely well to osteopathic manipulation.  Discussed with patient medicine regimen and home exercises.  Has had Flexeril for any type of breakthrough pain.  Not taking anti-inflammatories regularly.  Seems to do well with 6 to 8-week intervals.

## 2022-04-14 NOTE — Patient Instructions (Signed)
Good to see you No shellfish for 2 weeks See me again in 6-8 weeks

## 2022-04-24 IMAGING — DX DG THORACIC SPINE 2V
2 series · 2 of 2 positions shown · non-contrast
Comparison: None.

CLINICAL DATA: Chronic thoracic back pain, no known injury

EXAM:
THORACIC SPINE 2 VIEWS

[t-spine ap]
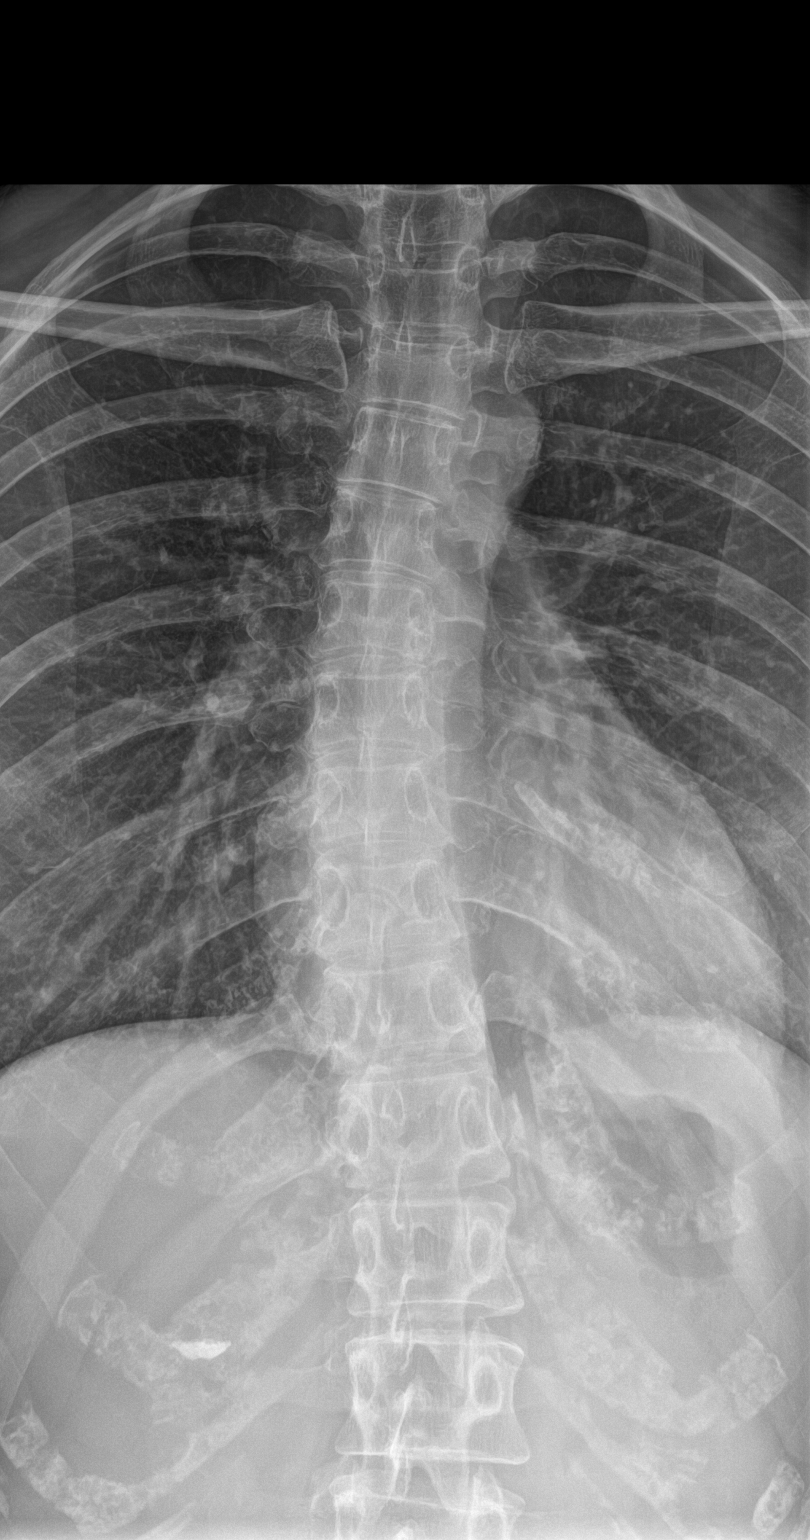

[t-spine lat]
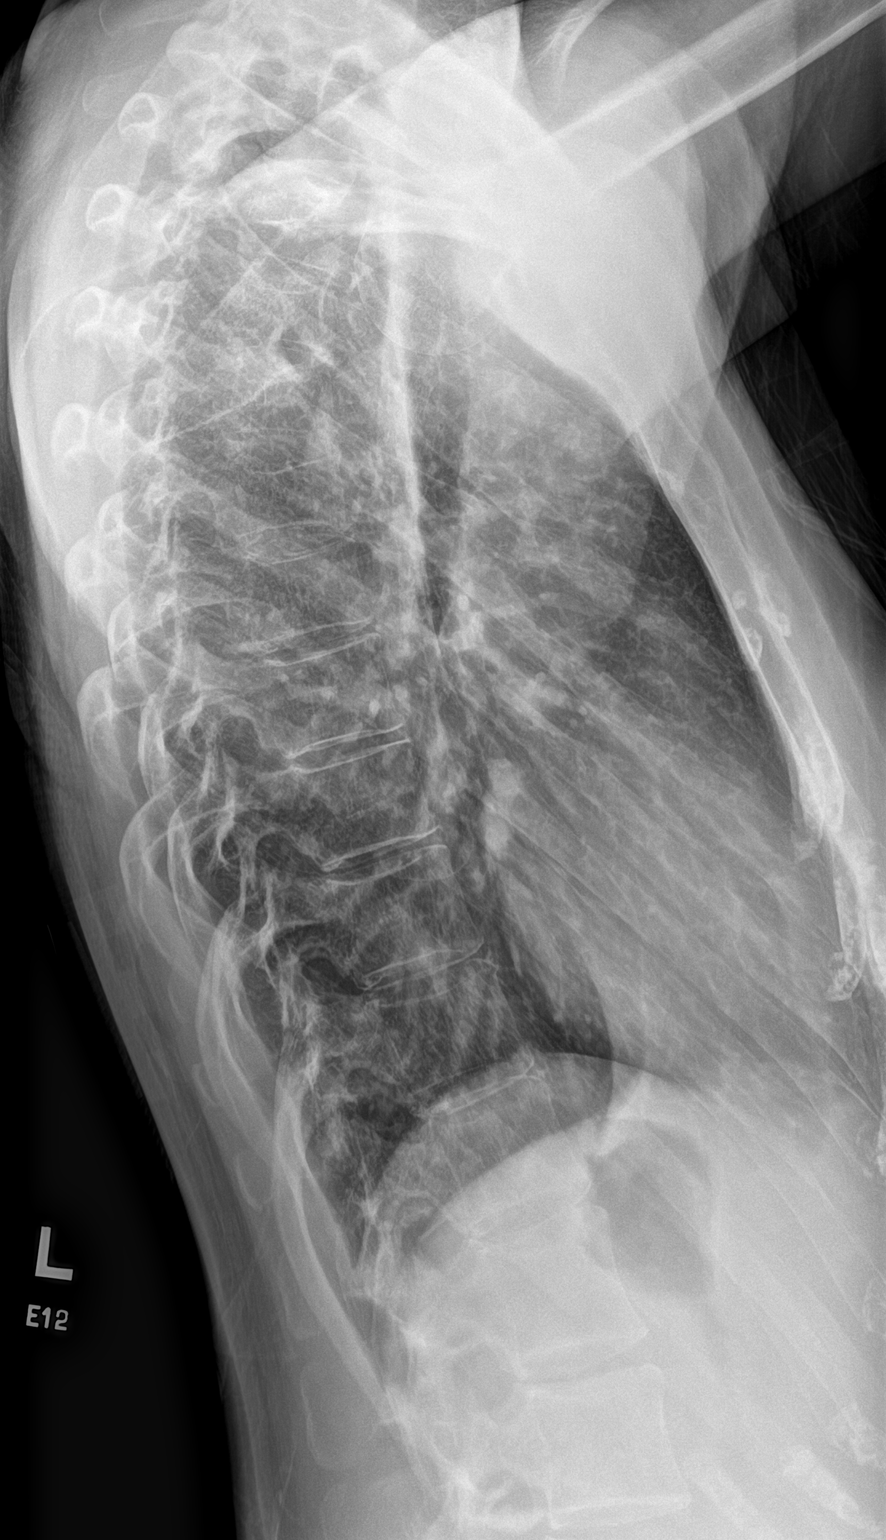

[2 of 2 positions shown; findings below may reference images not displayed]

FINDINGS: There is no evidence of thoracic spine fracture. Gentle
dextroscoliosis of the thoracic spine, apex T8. No other significant
bone abnormalities are identified.
IMPRESSION: No fracture or static subluxation of the thoracic spine. Gentle
dextroscoliosis of the thoracic spine, apex T8. Disc spaces are
preserved.

## 2022-05-22 ENCOUNTER — Encounter: Payer: Self-pay | Admitting: Family Medicine

## 2022-05-25 NOTE — Progress Notes (Unsigned)
   Allison [name] D.[place] Phone: [phone]   Assessment and Plan:     There are no diagnoses linked to this encounter.  *** - Patient has received significant relief with OMT in the past.  Elects for repeat OMT today.  Tolerated well per note below. - Decision today to treat with OMT was based on Physical Exam   After verbal consent patient was treated with HVLA (high velocity low amplitude), ME (muscle energy), FPR (flex positional release), ST (soft tissue), PC/PD (Pelvic Compression/ Pelvic Decompression) techniques in cervical, rib, thoracic, lumbar, and pelvic areas. Patient tolerated the procedure well with improvement in symptoms.  Patient educated on potential side effects of soreness and recommended to rest, hydrate, and use Tylenol as needed for pain control.   Pertinent previous records reviewed include ***   Follow Up: ***     Subjective:   I, Pincus Badder, am serving as a Education administrator for Doctor Glennon Mac  Chief Complaint: OMT  HPI:  04/14/2022 Allison [name] is a 36 y.o. female coming in with complaint of back and neck pain. OMT 03/03/2022. Patient states that her L hip and R cervical spine seem more tight than usual.  Patient states nothing new recently.  Reviewed outside notes and patient has had genetic testing done for the melanoma.  Results still should not be back for another 3 weeks.  05/26/2022 Patient states  Relevant Historical Information: ***  Additional pertinent review of systems negative.  Current Outpatient Medications  Medication Sig Dispense Refill   Ascorbic Acid (VITAMIN C) 500 MG CAPS Take 1 capsule by mouth daily.     cholecalciferol (VITAMIN D) 1000 units tablet Take 2,000 Units by mouth daily.     cyclobenzaprine (FLEXERIL) 5 MG tablet Take 1 tablet (5 mg total) by mouth 3 (three) times daily as needed for muscle spasms. 30 tablet 0   Polysaccharide Iron Complex  (NU-IRON PO) Take 1 tablet by mouth daily.      predniSONE (DELTASONE) 20 MG tablet Take 2 tablets (40 mg total) by mouth daily with breakfast. 10 tablet 0   No current facility-administered medications for this visit.      Objective:     There were no vitals filed for this visit.    There is no height or weight on file to calculate BMI.    Physical Exam:     General: Well-appearing, cooperative, sitting comfortably in no acute distress.   OMT Physical Exam:  ASIS Compression Test: Positive Right Cervical: TTP paraspinal, *** Rib: Bilateral elevated first rib with TTP Thoracic: TTP paraspinal,*** Lumbar: TTP paraspinal,*** Pelvis: Right anterior innominate  Electronically signed by:  Allison [name] D.[name] 11:36 AM 05/25/22

## 2022-05-26 ENCOUNTER — Ambulatory Visit: Payer: Federal, State, Local not specified - PPO | Admitting: Sports Medicine

## 2022-05-26 VITALS — BP 102/80 | HR 88 | Ht 66.0 in | Wt 134.0 lb

## 2022-05-26 DIAGNOSIS — M9903 Segmental and somatic dysfunction of lumbar region: Secondary | ICD-10-CM | POA: Diagnosis not present

## 2022-05-26 DIAGNOSIS — M9904 Segmental and somatic dysfunction of sacral region: Secondary | ICD-10-CM | POA: Diagnosis not present

## 2022-05-26 DIAGNOSIS — M533 Sacrococcygeal disorders, not elsewhere classified: Secondary | ICD-10-CM

## 2022-05-26 DIAGNOSIS — M9905 Segmental and somatic dysfunction of pelvic region: Secondary | ICD-10-CM | POA: Diagnosis not present

## 2022-05-26 MED ORDER — MELOXICAM 7.5 MG PO TABS: 7.5000 mg | ORAL_TABLET | Freq: Every day | ORAL | 0 refills | Status: AC

## 2022-05-26 NOTE — Patient Instructions (Addendum)
Good to see you  - Start meloxicam 7.5  mg daily x2 weeks.  If still having pain after 2 weeks, complete 3rd-week of meloxicam. May use remaining meloxicam as needed once daily for pain control.  Do not to use additional NSAIDs while taking meloxicam.  May use Tylenol [phone] mg 2 to 3 times a day for breakthrough pain. As needed follow up if no improvement 2-3 weeks

## 2022-05-27 ENCOUNTER — Other Ambulatory Visit: Payer: Self-pay | Admitting: Family Medicine

## 2022-05-27 MED ORDER — PREDNISONE 20 MG PO TABS: 40.0000 mg | ORAL_TABLET | Freq: Every day | ORAL | 0 refills | Status: DC

## 2022-05-27 NOTE — Progress Notes (Signed)
Done for exacerbation of back pain

## 2022-06-04 NOTE — Progress Notes (Signed)
[place] Phone: [phone] Subjective:   Allison Allison [name], am serving as a scribe for Dr. Hulan Saas.  I'm seeing this patient by the request  of:  Edythe Lynn, MD  CC: Back and neck pain follow-up  [id number]  Allison Allison [name] is a 36 y.o. female coming in with complaint of back and neck pain. OMT 05/26/2022. Patient states that SI joint is doing better. Prednisone and massage helped. Upper back tightness.  Has noticed some mild increase in tightness noted when she has been doing more cycling recently.  Medications patient has been prescribed: Prednisone  Taking:         Reviewed prior external information including notes and imaging from previsou exam, outside providers and external EMR if available.   As well as notes that were available from care everywhere and other healthcare systems.  Past medical history, social, surgical and family history all reviewed in electronic medical record.  Allison [name] unless stated regarding to the chief complaint.   Past Medical History:  Diagnosis Date   Chicken pox    Heart murmur    History of lumbar compression fracture 06/27/2018   Compression fracture L2-L3 Dexa done 07/05/17 report in chart CT lumbar report from 04/22/17 in chart as well as xray from 04/19/17   Lactose intolerance 07/11/2018   Age 62   Osteoporosis, followed by Dr. Buddy Duty, thought to be due to Accutane 07/27/2017   Done density done 07/05/18 report in chart.    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 POSITIVE muscle aches  Objective  Blood pressure 110/72, pulse 82, height '5\' 6"'$  (1.676 m), weight 133 lb (60.3 kg), SpO2 99 %.   General: Allison [name] apparent distress alert and oriented x3 mood and affect normal, dressed appropriately.  HEENT: Pupils equal, extraocular movements intact  Respiratory: Patient's speak in full sentences and does not appear short of breath  Cardiovascular: Allison [name] lower extremity edema, non tender, Allison [name] erythema  Gait MSK:  Back low back exam does have some loss of lordosis.  Some tightness noted in the paraspinal musculature.  Negative straight leg test but patient does have tenderness over the greater trochanteric area in the piriformis  Osteopathic findings  C2 flexed rotated and side bent right C5 flexed rotated and side bent left T3 extended rotated and side bent right inhaled rib T9 extended rotated and side bent left L1 flexed rotated and side bent right Sacrum left on left Pelvic shear noted      Assessment and Plan:  SI (sacroiliac) joint dysfunction Chronic with mild exacerbation.  Continues to have more tightness on the left side.  Patient has responded well though to osteopathic manipulation.  History of the porosis but seems to be secondary to the Accutane.  Continue the vitamin D supplementation.  Patient has meloxicam for breakthrough pain if necessary.  Follow-up with me again in 6 to 8 weeks.    Nonallopathic problems  Decision  today to treat with OMT was based on Physical Exam  After verbal consent patient was treated with HVLA, ME, FPR techniques in cervical, rib, thoracic, lumbar, and sacral  areas  Patient tolerated the procedure well with improvement in symptoms  Patient given exercises, stretches and lifestyle modifications  See medications in patient instructions if given  Patient will  follow up in 4-8 weeks     The above documentation has been reviewed and is accurate and complete Lyndal Pulley, DO         Note: This dictation was prepared with Dragon dictation along with smaller phrase technology. Any transcriptional errors that result from this process are unintentional.

## 2022-06-10 ENCOUNTER — Ambulatory Visit (INDEPENDENT_AMBULATORY_CARE_PROVIDER_SITE_OTHER): Payer: Federal, State, Local not specified - PPO | Admitting: Family Medicine

## 2022-06-10 ENCOUNTER — Encounter: Payer: Self-pay | Admitting: Family Medicine

## 2022-06-10 VITALS — BP 110/72 | HR 82 | Ht 66.0 in | Wt 133.0 lb

## 2022-06-10 DIAGNOSIS — M9902 Segmental and somatic dysfunction of thoracic region: Secondary | ICD-10-CM

## 2022-06-10 DIAGNOSIS — M9901 Segmental and somatic dysfunction of cervical region: Secondary | ICD-10-CM | POA: Diagnosis not present

## 2022-06-10 DIAGNOSIS — M9904 Segmental and somatic dysfunction of sacral region: Secondary | ICD-10-CM | POA: Diagnosis not present

## 2022-06-10 DIAGNOSIS — M533 Sacrococcygeal disorders, not elsewhere classified: Secondary | ICD-10-CM | POA: Diagnosis not present

## 2022-06-10 DIAGNOSIS — M9908 Segmental and somatic dysfunction of rib cage: Secondary | ICD-10-CM

## 2022-06-10 DIAGNOSIS — M9903 Segmental and somatic dysfunction of lumbar region: Secondary | ICD-10-CM | POA: Diagnosis not present

## 2022-06-10 NOTE — Patient Instructions (Signed)
Great to see you Enjoy the massage See me in 6-7 weeks

## 2022-06-10 NOTE — Assessment & Plan Note (Signed)
Chronic with mild exacerbation.  Continues to have more tightness on the left side.  Patient has responded well though to osteopathic manipulation.  History of the porosis but seems to be secondary to the Accutane.  Continue the vitamin D supplementation.  Patient has meloxicam for breakthrough pain if necessary.  Follow-up with me again in 6 to 8 weeks.

## 2022-07-28 ENCOUNTER — Ambulatory Visit: Payer: Federal, State, Local not specified - PPO | Admitting: Family Medicine

## 2022-07-30 NOTE — Progress Notes (Unsigned)
[place] [name] Allison [name] Phone: [phone] Subjective:   Allison [name], am serving as a scribe for Dr. Hulan Saas.  I'm seeing this patient by the request  of:  Allison Lynn, MD  CC: back and neck pain follow up   [id number]  Allison [name] is a 36 y.o. female coming in with complaint of back and neck pain. OMT on 06/10/2022. Patient states that she was having L side of pubic symphysis was painful past few days.   Medications patient has been prescribed: prednisone  Taking:         Reviewed prior external information including notes and imaging from previsou exam, outside providers and external EMR if available.   As well as notes that were available from care everywhere and other healthcare systems.  Past medical history, social, surgical and family history all reviewed in electronic medical record.  [name] unless stated regarding to the chief complaint.   Past Medical History:  Diagnosis Date   Chicken pox    Heart murmur    History of lumbar compression fracture 06/27/2018   Compression fracture L2-L3 Dexa done 07/05/17 report in chart CT lumbar report from 04/22/17 in chart as well as xray from 04/19/17   Lactose intolerance 07/11/2018   Age 71   Osteoporosis, followed by Dr. Buddy Duty, thought to be due to Accutane 07/27/2017   Done density done 07/05/18 report in chart.    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 POSITIVE muscle aches  Objective  Blood pressure 102/76, pulse 82, height '5\' 6"'$  (1.676 m), weight 137 lb (62.1 kg), SpO2 98 %.   General: [name] apparent distress alert and oriented x3 mood and affect normal, dressed appropriately.  HEENT: Pupils equal, extraocular movements intact  Respiratory: Patient's speak in full sentences and does not appear short of breath  Cardiovascular: [name] lower extremity edema, non tender, [name] erythema  Gait MSK:  Back low back does seem to have more tightness.  Seems to be more on the left side of the lower back.  Seems to be more of the sacroiliac joint noted again.  Patient has negative straight leg test.  Some mild limited sidebending of the neck right than the left  Osteopathic findings  C3 flexed rotated and side bent right T3 extended rotated and side bent right inhaled rib T8 extended rotated and side bent left L2 flexed rotated and side bent right Sacrum left on left     Assessment and Plan:  SI (sacroiliac) joint dysfunction Chronic problem.  Has some more tightness recently.  Has been driving a little bit and getting ready for traveling.  We discussed the potential for prednisone.  Patient wanted to avoid it at the moment.  Discussed which activities to do and which ones to avoid.  Follow-up with me again in 6 to 8 weeks    Nonallopathic problems  Decision today to treat with OMT was based on Physical Exam  After verbal consent patient was treated with HVLA, ME, FPR techniques in cervical, rib, thoracic, lumbar, and sacral  areas  Patient tolerated the procedure well with improvement in symptoms  Patient given exercises, stretches and lifestyle modifications  See medications in patient instructions if given  Patient will follow up in 4-8 weeks     The above documentation has been reviewed and is accurate and complete Allison Pulley, DO         Note: This dictation was prepared with  Dragon dictation along with smaller phrase technology. Any transcriptional errors that result from this process are unintentional.

## 2022-08-04 ENCOUNTER — Encounter: Payer: Self-pay | Admitting: Family Medicine

## 2022-08-04 ENCOUNTER — Ambulatory Visit (INDEPENDENT_AMBULATORY_CARE_PROVIDER_SITE_OTHER): Payer: Federal, State, Local not specified - PPO | Admitting: Family Medicine

## 2022-08-04 VITALS — BP 102/76 | HR 82 | Ht 66.0 in | Wt 137.0 lb

## 2022-08-04 DIAGNOSIS — M9908 Segmental and somatic dysfunction of rib cage: Secondary | ICD-10-CM

## 2022-08-04 DIAGNOSIS — M9902 Segmental and somatic dysfunction of thoracic region: Secondary | ICD-10-CM

## 2022-08-04 DIAGNOSIS — M9901 Segmental and somatic dysfunction of cervical region: Secondary | ICD-10-CM

## 2022-08-04 DIAGNOSIS — M9904 Segmental and somatic dysfunction of sacral region: Secondary | ICD-10-CM | POA: Diagnosis not present

## 2022-08-04 DIAGNOSIS — M533 Sacrococcygeal disorders, not elsewhere classified: Secondary | ICD-10-CM | POA: Diagnosis not present

## 2022-08-04 DIAGNOSIS — M9903 Segmental and somatic dysfunction of lumbar region: Secondary | ICD-10-CM

## 2022-08-04 NOTE — Assessment & Plan Note (Signed)
Chronic problem.  Has some more tightness recently.  Has been driving a little bit and getting ready for traveling.  We discussed the potential for prednisone.  Patient wanted to avoid it at the moment.  Discussed which activities to do and which ones to avoid.  Follow-up with me again in 6 to 8 weeks

## 2022-08-04 NOTE — Patient Instructions (Signed)
Always good to see you Have a great time in [place] See me in 6 weeks

## 2022-09-16 NOTE — Progress Notes (Signed)
[place] Phone: [phone] Subjective:   Allison [name], am serving as a scribe for Dr. Hulan Saas.  I'm seeing this patient by the request  of:  Allison Lynn, MD  CC: Back and neck pain follow-up  [id number]  Allison [name] is a 36 y.o. female coming in with complaint of back and neck pain. OMT 08/04/2022. Patient states that she has been doing well since last visit.   Medications patient has been prescribed: None          Reviewed prior external information including notes and imaging from previsou exam, outside providers and external EMR if available.   As well as notes that were available from care everywhere and other healthcare systems.  Past medical history, social, surgical and family history all reviewed in electronic medical record.  [name] unless stated regarding to the chief complaint.   Past Medical History:  Diagnosis Date   Chicken pox    Heart murmur    History of lumbar compression fracture 06/27/2018   Compression fracture L2-L3 Dexa done 07/05/17 report in chart CT lumbar report from 04/22/17 in chart as well as xray from 04/19/17   Lactose intolerance 07/11/2018   Age 64   Osteoporosis, followed by Dr. Buddy Duty, thought to be due to Accutane 07/27/2017   Done density done 07/05/18 report in chart.    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  POSITIVE muscle aches  Objective  Blood pressure 112/74, pulse 88, height '5\' 6"'$  (1.676 m), weight 139 lb (63 kg), SpO2 98 %.   General: [name] apparent distress alert and oriented x3 mood and affect normal, dressed appropriately.  HEENT: Pupils equal, extraocular movements intact  Respiratory: Patient's speak in full sentences and does not appear short of breath  Cardiovascular: [name] lower extremity edema, non tender, [name] erythema  Gait normal  MSK:  Back right SI joint does have severe pain. Negative faber  TTP in the lumbar   Osteopathic findings  C2 flexed rotated and side bent right C7 flexed rotated and side bent left T3 extended rotated and side bent right inhaled rib T8 extended rotated and side bent left L2 flexed rotated and side bent right Sacrum right on right     Assessment and Plan:  SI (sacroiliac) joint dysfunction Discussed HEP, return to certain activities  Discussed some other changes.  RTC in 6-8 weeks     Nonallopathic problems  Decision today to treat with OMT was based on Physical Exam  After verbal consent patient was treated with HVLA, ME, FPR techniques in cervical, rib, thoracic, lumbar, and sacral  areas  Patient tolerated the procedure well with improvement in symptoms  Patient given exercises, stretches and lifestyle modifications  See medications in patient instructions if given  Patient will follow up in 4-8 weeks    The above documentation has been reviewed and is accurate and complete Lyndal Pulley, DO  Note: This dictation was prepared with Dragon dictation along with smaller phrase technology. Any transcriptional errors that result from this process are unintentional.

## 2022-09-22 ENCOUNTER — Ambulatory Visit (INDEPENDENT_AMBULATORY_CARE_PROVIDER_SITE_OTHER): Payer: Federal, State, Local not specified - PPO | Admitting: Family Medicine

## 2022-09-22 ENCOUNTER — Encounter: Payer: Self-pay | Admitting: Family Medicine

## 2022-09-22 VITALS — BP 112/74 | HR 88 | Ht 66.0 in | Wt 139.0 lb

## 2022-09-22 DIAGNOSIS — M533 Sacrococcygeal disorders, not elsewhere classified: Secondary | ICD-10-CM | POA: Diagnosis not present

## 2022-09-22 DIAGNOSIS — M9903 Segmental and somatic dysfunction of lumbar region: Secondary | ICD-10-CM | POA: Diagnosis not present

## 2022-09-22 DIAGNOSIS — M9908 Segmental and somatic dysfunction of rib cage: Secondary | ICD-10-CM | POA: Diagnosis not present

## 2022-09-22 DIAGNOSIS — M9902 Segmental and somatic dysfunction of thoracic region: Secondary | ICD-10-CM

## 2022-09-22 DIAGNOSIS — M9904 Segmental and somatic dysfunction of sacral region: Secondary | ICD-10-CM | POA: Diagnosis not present

## 2022-09-22 DIAGNOSIS — M9901 Segmental and somatic dysfunction of cervical region: Secondary | ICD-10-CM

## 2022-09-22 NOTE — Assessment & Plan Note (Signed)
Discussed HEP, return to certain activities  Discussed some other changes.  RTC in 6-8 weeks

## 2022-09-22 NOTE — Patient Instructions (Signed)
Great to see you Happy Holidays See me again in 6-8 weeks

## 2022-10-30 NOTE — Progress Notes (Unsigned)
[place] Phone: [phone] Subjective:   Allison [name], am serving as a scribe for Dr. Hulan Saas.  I'm seeing this patient by the request  of:  Edythe Lynn, MD  CC: Neck and back pain follow-up  [id number]  Allison [name] is a 36 y.o. female coming in with complaint of back and neck pain. OMT 09/22/2022.  Patient states that L elbow pain has been increasing over lateral aspect. Notes a pull from her shoulder to the elbow with abduction. Feels that pain is coming from her neck.   Has been having increase in ankle pain after doing leg day in the gym last week. Taking prednisone and this is helping her pain. Pain is worse in L and pain is over medial aspect.   Medications patient has been prescribed: None  Taking:         Reviewed prior external information including notes and imaging from previsou exam, outside providers and external EMR if available.   As well as notes that were available from care everywhere and other healthcare systems.  Past medical history, social, surgical and family history all reviewed in electronic medical record.  [name] unless stated regarding to the chief complaint.   Past Medical History:  Diagnosis Date   Chicken pox    Heart murmur    History of lumbar compression fracture 06/27/2018   Compression fracture L2-L3 Dexa done 07/05/17 report in chart CT lumbar report from 04/22/17 in chart as well as xray from 04/19/17   Lactose intolerance 07/11/2018   Age 72   Osteoporosis, followed by Dr. Buddy Duty, thought to be due to Accutane 07/27/2017   Done density done 07/05/18 report in chart.    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 POSITIVE muscle aches  Objective  Blood pressure 110/72, pulse 81, height '5\' 6"'$  (1.676 m), weight 139 lb (63 kg), SpO2 99 %.   General: [name] apparent distress alert and oriented x3 mood and affect normal, dressed appropriately.  HEENT: Pupils equal, extraocular movements intact  Respiratory: Patient's speak in full sentences and does not appear short of breath  Cardiovascular: [name] lower extremity edema, non tender, [name] erythema   Back low back exam does have some loss of lordosis.  Osteopathic findings  C3 flexed rotated and side bent right C5 flexed rotated and side bent left T3 extended rotated and side bent right inhaled rib T6 extended rotated and side bent left L3 flexed rotated and side bent right Sacrum right on right Pelvic shear noted      Assessment and Plan:  Slipped rib syndrome Patient does have a slipped rib syndrome noted.  Discussed with patient icing regimen and home exercises, which activities to do and which ones to avoid.  Increase activity slowly over the course the next several weeks.  Follow-up with me again in 6 to 8 weeks.    Nonallopathic problems  Decision today to treat with OMT  was based on Physical Exam  After verbal consent patient was treated with HVLA, ME, FPR techniques in cervical, rib, thoracic, lumbar, and sacral and pelvis areas  Patient tolerated the procedure well with improvement in symptoms  Patient given exercises, stretches and lifestyle modifications  See medications in patient instructions if given  Patient will follow up in 4-8 weeks     The above documentation has been reviewed and is accurate and complete Lyndal Pulley,  DO         Note: This dictation was prepared with Dragon dictation along with smaller phrase technology. Any transcriptional errors that result from this process are unintentional.

## 2022-11-03 ENCOUNTER — Ambulatory Visit (INDEPENDENT_AMBULATORY_CARE_PROVIDER_SITE_OTHER): Payer: Federal, State, Local not specified - PPO | Admitting: Family Medicine

## 2022-11-03 VITALS — BP 110/72 | HR 81 | Ht 66.0 in | Wt 139.0 lb

## 2022-11-03 DIAGNOSIS — M9903 Segmental and somatic dysfunction of lumbar region: Secondary | ICD-10-CM

## 2022-11-03 DIAGNOSIS — M940 Chondrocostal junction syndrome [Tietze]: Secondary | ICD-10-CM

## 2022-11-03 DIAGNOSIS — M9904 Segmental and somatic dysfunction of sacral region: Secondary | ICD-10-CM

## 2022-11-03 DIAGNOSIS — M9905 Segmental and somatic dysfunction of pelvic region: Secondary | ICD-10-CM

## 2022-11-03 DIAGNOSIS — M9908 Segmental and somatic dysfunction of rib cage: Secondary | ICD-10-CM | POA: Diagnosis not present

## 2022-11-03 DIAGNOSIS — M9901 Segmental and somatic dysfunction of cervical region: Secondary | ICD-10-CM | POA: Diagnosis not present

## 2022-11-03 DIAGNOSIS — M9902 Segmental and somatic dysfunction of thoracic region: Secondary | ICD-10-CM

## 2022-11-03 NOTE — Patient Instructions (Signed)
Good to see you Happy Holidays  Thanks for enlarging my love handles See me again in 6 weeks

## 2022-11-03 NOTE — Assessment & Plan Note (Signed)
Patient does have a slipped rib syndrome noted.  Discussed with patient icing regimen and home exercises, which activities to do and which ones to avoid.  Increase activity slowly over the course the next several weeks.  Follow-up with me again in 6 to 8 weeks.

## 2022-11-09 ENCOUNTER — Encounter: Payer: Self-pay | Admitting: Family Medicine

## 2022-11-11 ENCOUNTER — Ambulatory Visit: Payer: Federal, State, Local not specified - PPO | Admitting: Family Medicine

## 2022-11-11 ENCOUNTER — Ambulatory Visit (INDEPENDENT_AMBULATORY_CARE_PROVIDER_SITE_OTHER): Payer: Federal, State, Local not specified - PPO | Admitting: Family Medicine

## 2022-11-11 ENCOUNTER — Encounter: Payer: Self-pay | Admitting: Family Medicine

## 2022-11-11 VITALS — BP 102/72 | HR 81 | Ht 66.0 in | Wt 138.0 lb

## 2022-11-11 DIAGNOSIS — M9908 Segmental and somatic dysfunction of rib cage: Secondary | ICD-10-CM

## 2022-11-11 DIAGNOSIS — M533 Sacrococcygeal disorders, not elsewhere classified: Secondary | ICD-10-CM

## 2022-11-11 DIAGNOSIS — M9903 Segmental and somatic dysfunction of lumbar region: Secondary | ICD-10-CM

## 2022-11-11 DIAGNOSIS — M25512 Pain in left shoulder: Secondary | ICD-10-CM

## 2022-11-11 DIAGNOSIS — M9902 Segmental and somatic dysfunction of thoracic region: Secondary | ICD-10-CM

## 2022-11-11 DIAGNOSIS — M9904 Segmental and somatic dysfunction of sacral region: Secondary | ICD-10-CM

## 2022-11-11 DIAGNOSIS — M9901 Segmental and somatic dysfunction of cervical region: Secondary | ICD-10-CM | POA: Diagnosis not present

## 2022-11-11 MED ORDER — NAPROXEN 500 MG PO TABS: 500.0000 mg | ORAL_TABLET | Freq: Two times a day (BID) | ORAL | 2 refills | Status: AC | PRN

## 2022-11-11 MED ORDER — GABAPENTIN 100 MG PO CAPS: 200.0000 mg | ORAL_CAPSULE | Freq: Every day | ORAL | 0 refills | Status: DC

## 2022-11-11 NOTE — Progress Notes (Signed)
[place] Allison [name] [place] Phone: [phone] Subjective:   Allison Allison [name], am serving as a scribe for Dr. Hulan [name].  I'm seeing this patient by the request  of:  Allison Lynn, MD  CC: Back and neck pain follow-up  [id number]  Allison Allison [name] is a 36 y.o. female coming in with complaint of back and neck pain Patient states that she has pain in the upper arm with abduction. Allison [name] injury to area.   Medications patient has been prescribed:   Taking:         Reviewed prior external information including notes and imaging from previsou exam, outside providers and external EMR if available.   As well as notes that were available from care everywhere and other healthcare systems.  Past medical history, social, surgical and family history all reviewed in electronic medical record.  Allison [name] unless stated regarding to the chief complaint.   Past Medical History:  Diagnosis Date   Chicken pox    Heart murmur    History of lumbar compression fracture 06/27/2018   Compression fracture L2-L3 Dexa done 07/05/17 report in chart CT lumbar report from 04/22/17 in chart as well as xray from 04/19/17   Lactose intolerance 07/11/2018   Age 70   Osteoporosis, followed by Dr. Buddy [name], thought to be due to Accutane 07/27/2017   Done density done 07/05/18 report in chart.    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 POSITIVE muscle aches  Objective  Blood pressure 102/72, pulse 81, height '5\' 6"'$  (1.676 m), weight 138 lb (62.6 kg), SpO2 98 %.   General: Allison [name] apparent distress alert and oriented x3 mood and affect normal, dressed appropriately.  HEENT: Pupils equal, extraocular movements intact  Respiratory: Patient's speak in full sentences and does not appear short of breath  Cardiovascular: Allison [name] lower extremity edema, non tender, Allison [name] erythema  Arm exam shows some very mild pain with adduction of the shoulder.  Patient does have a mild positive crossover.  Rotator cuff strength is intact.  Osteopathic findings  C2 flexed rotated and side bent right C6 flexed rotated and side bent left T3 extended rotated and side bent right inhaled rib T9 extended rotated and side bent left L2 flexed rotated and side bent right Sacrum right on right   Limited muscular skeletal ultrasound was performed and interpreted by Allison Allison [name], M  Limited ultrasound of patient's left shoulder shows some mild hypoechoic changes that consistent with possible bursitis.  Condition at this time patient also has some thickening of the anterior capsule noted.  Trace effusion noted in the glenohumeral joint    Assessment and Plan:  Left anterior shoulder pain Patient has had left shoulder pain.  Seems to be more of a bursitis but could be early frozen shoulder.  Home exercises given.  Discussed icing regimen.  Worsening pain consider injections in the next 2 to 4 weeks.  Hopefully patient will make some improvement.  SI (  sacroiliac) joint dysfunction Continue tightness noted.  Discussed posture and ergonomics, discussed which activities to do and which ones to avoid.  Increase activity slowly over the course of next several weeks.  Follow-up again in 6 to 8 weeks    Nonallopathic problems  Decision today to treat with OMT was based on Physical Exam  After verbal consent patient was treated with HVLA, ME, FPR  techniques in cervical, rib, thoracic, lumbar, and sacral  areas  Patient tolerated the procedure well with improvement in symptoms  Patient given exercises, stretches and lifestyle modifications  See medications in patient instructions if given  Patient will follow up in 4-8 weeks     The above documentation has been reviewed and is accurate and complete Allison Pulley, DO         Note: This dictation was prepared with Dragon dictation along with smaller phrase technology. Any transcriptional errors that result from this process are unintentional.

## 2022-11-11 NOTE — Patient Instructions (Signed)
Exercises Gabapentin '200mg'$  at night Naproxen '500mg'$  2x a day for 10 days then as needed See me again as scheduled

## 2022-11-11 NOTE — Assessment & Plan Note (Signed)
Continue tightness noted.  Discussed posture and ergonomics, discussed which activities to do and which ones to avoid.  Increase activity slowly over the course of next several weeks.  Follow-up again in 6 to 8 weeks

## 2022-11-11 NOTE — Assessment & Plan Note (Signed)
Patient has had left shoulder pain.  Seems to be more of a bursitis but could be early frozen shoulder.  Home exercises given.  Discussed icing regimen.  Worsening pain consider injections in the next 2 to 4 weeks.  Hopefully patient will make some improvement.

## 2022-12-04 ENCOUNTER — Encounter: Payer: Self-pay | Admitting: Family Medicine

## 2022-12-08 ENCOUNTER — Ambulatory Visit (INDEPENDENT_AMBULATORY_CARE_PROVIDER_SITE_OTHER): Payer: Federal, State, Local not specified - PPO | Admitting: Family Medicine

## 2022-12-08 VITALS — BP 102/66 | HR 84 | Ht 66.0 in | Wt 141.0 lb

## 2022-12-08 DIAGNOSIS — M9902 Segmental and somatic dysfunction of thoracic region: Secondary | ICD-10-CM

## 2022-12-08 DIAGNOSIS — M9904 Segmental and somatic dysfunction of sacral region: Secondary | ICD-10-CM | POA: Diagnosis not present

## 2022-12-08 DIAGNOSIS — M9908 Segmental and somatic dysfunction of rib cage: Secondary | ICD-10-CM

## 2022-12-08 DIAGNOSIS — M940 Chondrocostal junction syndrome [Tietze]: Secondary | ICD-10-CM | POA: Diagnosis not present

## 2022-12-08 DIAGNOSIS — M9901 Segmental and somatic dysfunction of cervical region: Secondary | ICD-10-CM | POA: Diagnosis not present

## 2022-12-08 DIAGNOSIS — M9903 Segmental and somatic dysfunction of lumbar region: Secondary | ICD-10-CM | POA: Diagnosis not present

## 2022-12-08 MED ORDER — TIZANIDINE HCL 2 MG PO TABS: 2.0000 mg | ORAL_TABLET | Freq: Every day | ORAL | 0 refills | Status: AC

## 2022-12-08 MED ORDER — PREDNISONE 20 MG PO TABS: 40.0000 mg | ORAL_TABLET | Freq: Every day | ORAL | 0 refills | Status: DC

## 2022-12-08 NOTE — Progress Notes (Unsigned)
[place] [facility] Phone: [phone] Subjective:   Allison Allison [name], am serving as a scribe for Dr. Hulan Allison [name].  I'm seeing this patient by the request  of:  Allison Lynn, MD  CC: Upper back pain follow-up  [id number]  Allison Allison [name] is a 37 y.o. female coming in with complaint of back and neck pain. OMT 11/11/2022. Patient states that on Thursday she was making the bed and she felt some tightness developing in the L trap and  Cspine. Tightness subsided after 48 hours but she still feels like something is going on in the trap and shoulder. Has some numbness with horizontal abduction and IR.   Medications patient has been prescribed:   Taking:         Reviewed prior external information including notes and imaging from previsou exam, outside providers and external EMR if available.   As well as notes that were available from care everywhere and other healthcare systems.  Past medical history, social, surgical and family history all reviewed in electronic medical record.  Allison [name] unless stated regarding to the chief complaint.   Past Medical History:  Diagnosis Date   Chicken pox    Heart murmur    History of lumbar compression fracture 06/27/2018   Compression fracture L2-L3 Dexa done 07/05/17 report in chart CT lumbar report from 04/22/17 in chart as well as xray from 04/19/17   Lactose intolerance 07/11/2018   Age 23   Osteoporosis, followed by Dr. Buddy [name], thought to be due to Accutane 07/27/2017   Done density done 07/05/18 report in chart.    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 POSITIVE muscle aches  Objective  Blood pressure 102/66, pulse 84, height '5\' 6"'$  (1.676 m), weight 141 lb (64 kg), SpO2 99 %.   General: Allison [name] apparent distress alert and oriented x3 mood and affect normal, dressed appropriately.  HEENT: Pupils equal, extraocular movements intact  Respiratory: Patient's speak in full sentences and does not appear short of breath  Cardiovascular: Allison [name] lower extremity edema, non tender, Allison [name] erythema  Continue tightness noted in the paraspinal musculature mostly in the parascapular area l right greater than left  Osteopathic findings  C2 flexed rotated and side bent right C6 flexed rotated and side bent left T3 extended rotated and side bent right inhaled rib T9 extended rotated and side bent left L2 flexed rotated and side bent right Sacrum right on right       Assessment and Plan:  Slipped rib syndrome Continue to have difficulty on the neck and the right shoulder blade.  Discussed with patient about icing regimen and home exercises.  Discussed which activities to do and which ones to avoid.  Prednisone given.  Patient happy for her trip with patient traveling internationally.  Zanaflex also prescribed.  Follow-up again in 6 to 8 weeks    Nonallopathic problems  Decision today to treat with OMT was based on Physical Exam  After verbal consent patient was treated with HVLA, ME, FPR techniques in cervical, rib, thoracic, lumbar, and sacral  areas  Patient tolerated the procedure well with improvement in symptoms  Patient given exercises, stretches and lifestyle modifications  See medications in patient instructions if given  Patient will follow up in 4-8 weeks    The above documentation has been reviewed and is accurate and complete  Allison Pulley, DO          Note: This dictation was prepared with Dragon dictation along with smaller phrase technology. Any transcriptional errors that result from this process are unintentional.

## 2022-12-08 NOTE — Patient Instructions (Signed)
Zanaflex '2mg'$  Pred '40mg'$  for 5 days [facility] See me as scheduled.

## 2022-12-09 ENCOUNTER — Encounter: Payer: Self-pay | Admitting: Family Medicine

## 2022-12-09 NOTE — Assessment & Plan Note (Signed)
Continue to have difficulty on the neck and the right shoulder blade.  Discussed with patient about icing regimen and home exercises.  Discussed which activities to do and which ones to avoid.  Prednisone given.  Patient happy for her trip with patient traveling internationally.  Zanaflex also prescribed.  Follow-up again in 6 to 8 weeks

## 2023-01-05 NOTE — Progress Notes (Unsigned)
  [place] Phone: [phone] Subjective:    I'm seeing this patient by the request  of:  Edythe Lynn, MD  CC:   [id number]  Allison [name] is a 37 y.o. female coming in with complaint of back and neck pain. OMT 12/08/2022. Patient states   Medications patient has been prescribed: Zanaflex, Prednisone  Taking:         Reviewed prior external information including notes and imaging from previsou exam, outside providers and external EMR if available.   As well as notes that were available from care everywhere and other healthcare systems.  Past medical history, social, surgical and family history all reviewed in electronic medical record.  No pertanent information unless stated regarding to the chief complaint.   Past Medical History:  Diagnosis Date   Chicken pox    Heart murmur    History of lumbar compression fracture 06/27/2018   Compression fracture L2-L3 Dexa done 07/05/17 report in chart CT lumbar report from 04/22/17 in chart as well as xray from 04/19/17   Lactose intolerance 07/11/2018   Age 28   Osteoporosis, followed by Dr. Buddy Duty, thought to be due to Accutane 07/27/2017   Done density done 07/05/18 report in chart.    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 POSITIVE muscle aches  Objective  There were no vitals taken for this visit.   General: No apparent distress  alert and oriented x3 mood and affect normal, dressed appropriately.  HEENT: Pupils equal, extraocular movements intact  Respiratory: Patient's speak in full sentences and does not appear short of breath  Cardiovascular: No lower extremity edema, non tender, no erythema  Gait MSK:  Back   Osteopathic findings  C2 flexed rotated and side bent right C6 flexed rotated and side bent left T3 extended rotated and side bent right inhaled rib T9 extended rotated and side bent left L2 flexed rotated and side bent right Sacrum right on right       Assessment and Plan:  No problem-specific Assessment & Plan notes found for this encounter.    Nonallopathic problems  Decision today to treat with OMT was based on Physical Exam  After verbal consent patient was treated with HVLA, ME, FPR techniques in cervical, rib, thoracic, lumbar, and sacral  areas  Patient tolerated the procedure well with improvement in symptoms  Patient given exercises, stretches and lifestyle modifications  See medications in patient instructions if given  Patient will follow up in 4-8 weeks             Note: This dictation was prepared with Dragon dictation along with smaller phrase technology. Any transcriptional errors that result from this process are unintentional.

## 2023-01-06 ENCOUNTER — Encounter: Payer: Self-pay | Admitting: Family Medicine

## 2023-01-06 ENCOUNTER — Ambulatory Visit (INDEPENDENT_AMBULATORY_CARE_PROVIDER_SITE_OTHER): Payer: Federal, State, Local not specified - PPO | Admitting: Family Medicine

## 2023-01-06 VITALS — BP 106/88 | HR 75 | Ht 66.0 in | Wt 143.0 lb

## 2023-01-06 DIAGNOSIS — M940 Chondrocostal junction syndrome [Tietze]: Secondary | ICD-10-CM | POA: Diagnosis not present

## 2023-01-06 DIAGNOSIS — M9901 Segmental and somatic dysfunction of cervical region: Secondary | ICD-10-CM | POA: Diagnosis not present

## 2023-01-06 DIAGNOSIS — M9904 Segmental and somatic dysfunction of sacral region: Secondary | ICD-10-CM | POA: Diagnosis not present

## 2023-01-06 DIAGNOSIS — M9902 Segmental and somatic dysfunction of thoracic region: Secondary | ICD-10-CM

## 2023-01-06 DIAGNOSIS — M9903 Segmental and somatic dysfunction of lumbar region: Secondary | ICD-10-CM | POA: Diagnosis not present

## 2023-01-06 DIAGNOSIS — M9908 Segmental and somatic dysfunction of rib cage: Secondary | ICD-10-CM | POA: Diagnosis not present

## 2023-01-06 NOTE — Assessment & Plan Note (Signed)
Believe it is more secondary to a slipped rib syndrome.  Seems to be more on the left side than her right side.  Hopefully this will help with some of the range of motion.  Discussed though that we are still concern for some potential frozen shoulder that we will continue to monitor.  Discussed icing regimen and home exercises.  Patient will be changing jobs and will be doing a little bit more at home where she will have more controlled over the working environment.  Follow-up again in 6 to 8 weeks patient does have prednisone for breakthrough pain.

## 2023-01-06 NOTE — Patient Instructions (Signed)
Congrats on another job Invest in a desk See me in 6 weeks

## 2023-02-22 NOTE — Progress Notes (Unsigned)
[name] [address] [place] [address] Phone: [phone] Subjective:    I'm seeing this patient by the request  of:  Alla Feeling, MD  CC: Leg pain or more numbness  [id number]  Allison [name] is a 37 y.o. female coming in with complaint of back and neck pain. OMT on 01/06/2023. Patient states usual tune up,  She notes she has decreased sensation on her left thigh, she saw PCP about it they are doing a nerve conduction study. she had a cut on her thigh but does not think it is related, she notes she has pain only after sitting but she is able to spin and hike as normal   Medications patient has been prescribed:   Taking:         Reviewed prior external information including notes and imaging from previsou exam, outside providers and external EMR if available.   As well as notes that were available from care everywhere and other healthcare systems.  Past medical history, social, surgical and family history all reviewed in electronic medical record.  No pertanent information unless stated regarding to the chief complaint.   Past Medical History:  Diagnosis Date   Chicken pox    Heart murmur    History of lumbar compression fracture 06/27/2018   Compression fracture L2-L3 Dexa done 07/05/17 report in chart CT lumbar report from 04/22/17 in chart as well as xray from 04/19/17   Lactose intolerance 07/11/2018   Age 2   Osteoporosis, followed by Dr. Sharl Ma, thought to be due to Accutane 07/27/2017   Done density done 07/05/18 report in chart.    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 POSITIVE muscle aches  Objective  Blood pressure 100/85, pulse 78, weight 140 lb (63.5 kg), SpO2 100 %.   General: No apparent distress alert and oriented x3 mood and affect normal, dressed appropriately.  HEENT: Pupils equal, extraocular movements intact  Left hip does have more tenderness over the sacroiliac joint than usual.  The patient does have some negative straight leg test.  Tightness in the hip abductor area as well.  Osteopathic findings  C3 flexed rotated and side bent left sacrum C6 flexed rotated and side bent left T3 extended rotated and side bent right inhaled rib T7 6 extended rotated and side bent left L2 flexed rotated and side bent right L3 flexed rotated and side bent left Sacrum left       Assessment and Plan:  Numbness of left anterior thigh Numbness of the thigh over the last 2 weeks.  No weakness noted.  We discussed that could be more in the pelvic aspect.  We discussed posture and ergonomics, we discussed patient doing more of the repetitive activity including the cycling could potentially be contributing.  Also patient did have the difficulty in her back previously at the fracture with and we may need to consider further evaluation.  X-rays ordered today to see if anything else is potentially contributing.  Follow-up with me again in 6 to 8 weeks otherwise    Nonallopathic problems  Decision today to treat with OMT was based on Physical Exam  After verbal consent patient was treated with HVLA, ME, FPR techniques in cervical, rib, thoracic, lumbar, and sacral  areas  Patient tolerated the procedure well with improvement in symptoms  Patient given exercises, stretches and lifestyle modifications  See medications in patient instructions if given  Patient will follow up in 4-8  weeks     The above documentation has been reviewed and is accurate and complete Judi Saa, DO   Tract        Note: This dictation was prepared with Dragon dictation along with smaller phrase technology. Any transcriptional errors that result from this process are unintentional.

## 2023-02-23 ENCOUNTER — Encounter: Payer: Self-pay | Admitting: Family Medicine

## 2023-02-23 ENCOUNTER — Ambulatory Visit (INDEPENDENT_AMBULATORY_CARE_PROVIDER_SITE_OTHER): Payer: Federal, State, Local not specified - PPO

## 2023-02-23 ENCOUNTER — Ambulatory Visit (INDEPENDENT_AMBULATORY_CARE_PROVIDER_SITE_OTHER): Payer: Federal, State, Local not specified - PPO | Admitting: Family Medicine

## 2023-02-23 VITALS — BP 100/85 | HR 78 | Wt 140.0 lb

## 2023-02-23 DIAGNOSIS — M9901 Segmental and somatic dysfunction of cervical region: Secondary | ICD-10-CM | POA: Diagnosis not present

## 2023-02-23 DIAGNOSIS — M9903 Segmental and somatic dysfunction of lumbar region: Secondary | ICD-10-CM

## 2023-02-23 DIAGNOSIS — M25552 Pain in left hip: Secondary | ICD-10-CM

## 2023-02-23 DIAGNOSIS — M545 Low back pain, unspecified: Secondary | ICD-10-CM

## 2023-02-23 DIAGNOSIS — M9908 Segmental and somatic dysfunction of rib cage: Secondary | ICD-10-CM

## 2023-02-23 DIAGNOSIS — R200 Anesthesia of skin: Secondary | ICD-10-CM | POA: Diagnosis not present

## 2023-02-23 DIAGNOSIS — M9904 Segmental and somatic dysfunction of sacral region: Secondary | ICD-10-CM

## 2023-02-23 DIAGNOSIS — M9902 Segmental and somatic dysfunction of thoracic region: Secondary | ICD-10-CM

## 2023-02-23 NOTE — Assessment & Plan Note (Signed)
Numbness of the thigh over the last 2 weeks.  No weakness noted.  We discussed that could be more in the pelvic aspect.  We discussed posture and ergonomics, we discussed patient doing more of the repetitive activity including the cycling could potentially be contributing.  Also patient did have the difficulty in her back previously at the fracture with and we may need to consider further evaluation.  X-rays ordered today to see if anything else is potentially contributing.  Follow-up with me again in 6 to 8 weeks otherwise

## 2023-02-23 NOTE — Patient Instructions (Signed)
Good to see you Xray L hip and lumbar today Continue to be active Go shoe shopping tell husband it's ok See me in 5-6 weeks

## 2023-04-02 NOTE — Progress Notes (Unsigned)
[name] [address] [place] [address] Phone: [phone] Subjective:   INadine Counts, am serving as a scribe for Dr. Antoine Primas.  I'm seeing this patient by the request  of:  Alla Feeling, MD  CC: Back and neck pain follow-up  [id number]  Allison [name] is a 37 y.o. female coming in with complaint of back and neck pain. OMT 02/23/2023. Patient states same per usual. No new concerns.  Has had some discomfort recently.  Doing a lot more weightlifting recently.  Medications patient has been prescribed: Zanaflex  Taking:         Reviewed prior external information including notes and imaging from previsou exam, outside providers and external EMR if available.   As well as notes that were available from care everywhere and other healthcare systems.  Past medical history, social, surgical and family history all reviewed in electronic medical record.  No pertanent information unless stated regarding to the chief complaint.   Past Medical History:  Diagnosis Date   Chicken pox    Heart murmur    History of lumbar compression fracture 06/27/2018   Compression fracture L2-L3 Dexa done 07/05/17 report in chart CT lumbar report from 04/22/17 in chart as well as xray from 04/19/17   Lactose intolerance 07/11/2018   Age 30   Osteoporosis, followed by Dr. Sharl Ma, thought to be due to Accutane 07/27/2017   Done density done 07/05/18 report in chart.    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 POSITIVE muscle aches  Objective  Blood pressure 114/68, pulse 80, height 5\' 6"  (1.676 m), weight 138 lb (62.6 kg), SpO2 96 %.   General: No apparent distress alert and oriented x3 mood and affect normal, dressed appropriately.  HEENT: Pupils equal, extraocular movements intact  Respiratory: Patient's speak in full sentences and does not appear short of breath  Cardiovascular: No lower extremity edema, non tender, no erythema  Back exam does have some loss lordosis noted.  Tightness around the cyst on the right sacroiliac joint noted. Patient also has tightness noted in the left parascapular area.  Neck exam does have some tightness noted.  Osteopathic findings  C6 flexed rotated and side bent left T6 extended rotated and side bent left inhaled rib L2 flexed rotated and side bent right Sacrum right on right    Assessment and Plan:  SI (sacroiliac) joint dysfunction Increasing tightness.  Recently did just do some cycling.  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  Discussed icing regimen and home exercises, discussed which activities to do and which ones to avoid.  Increase activity slowly over the course the next several weeks.  Discussed icing regimen.  Follow-up again in 6 to 8 weeks.    Nonallopathic problems  Decision today to treat with OMT was based on Physical Exam  After verbal consent patient was treated with HVLA, ME, FPR techniques in cervical, rib, thoracic, lumbar, and sacral  areas  Patient tolerated the procedure  well with improvement in symptoms  Patient given exercises, stretches and lifestyle modifications  See medications in patient instructions if given  Patient will follow up in 4-8 weeks     The above documentation has been reviewed and is accurate and complete Judi Saa, DO         Note: This dictation was prepared with Dragon dictation along with smaller phrase  technology. Any transcriptional errors that result from this process are unintentional.

## 2023-04-06 ENCOUNTER — Encounter: Payer: Self-pay | Admitting: Family Medicine

## 2023-04-06 ENCOUNTER — Ambulatory Visit (INDEPENDENT_AMBULATORY_CARE_PROVIDER_SITE_OTHER): Payer: Federal, State, Local not specified - PPO | Admitting: Family Medicine

## 2023-04-06 VITALS — BP 114/68 | HR 80 | Ht 66.0 in | Wt 138.0 lb

## 2023-04-06 DIAGNOSIS — M9902 Segmental and somatic dysfunction of thoracic region: Secondary | ICD-10-CM

## 2023-04-06 DIAGNOSIS — M9901 Segmental and somatic dysfunction of cervical region: Secondary | ICD-10-CM

## 2023-04-06 DIAGNOSIS — M533 Sacrococcygeal disorders, not elsewhere classified: Secondary | ICD-10-CM | POA: Diagnosis not present

## 2023-04-06 DIAGNOSIS — M9903 Segmental and somatic dysfunction of lumbar region: Secondary | ICD-10-CM | POA: Diagnosis not present

## 2023-04-06 DIAGNOSIS — M9904 Segmental and somatic dysfunction of sacral region: Secondary | ICD-10-CM

## 2023-04-06 DIAGNOSIS — M9908 Segmental and somatic dysfunction of rib cage: Secondary | ICD-10-CM | POA: Diagnosis not present

## 2023-04-06 NOTE — Patient Instructions (Signed)
Enjoy all the shows Branch chain amino acids 2-5 g after weights See me in 6-8 weeks

## 2023-04-06 NOTE — Assessment & Plan Note (Signed)
Increasing tightness.  Recently did just do some cycling.  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  Discussed icing regimen and home exercises, discussed which activities to do and which ones to avoid.  Increase activity slowly over the course the next several weeks.  Discussed icing regimen.  Follow-up again in 6 to 8 weeks.

## 2023-06-02 ENCOUNTER — Ambulatory Visit: Payer: Federal, State, Local not specified - PPO | Admitting: Family Medicine

## 2023-06-02 ENCOUNTER — Encounter: Payer: Self-pay | Admitting: Family Medicine

## 2023-06-02 VITALS — BP 106/62 | HR 76 | Ht 66.0 in | Wt 140.0 lb

## 2023-06-02 DIAGNOSIS — M9903 Segmental and somatic dysfunction of lumbar region: Secondary | ICD-10-CM | POA: Diagnosis not present

## 2023-06-02 DIAGNOSIS — M533 Sacrococcygeal disorders, not elsewhere classified: Secondary | ICD-10-CM

## 2023-06-02 DIAGNOSIS — M9904 Segmental and somatic dysfunction of sacral region: Secondary | ICD-10-CM | POA: Diagnosis not present

## 2023-06-02 DIAGNOSIS — M9901 Segmental and somatic dysfunction of cervical region: Secondary | ICD-10-CM | POA: Diagnosis not present

## 2023-06-02 DIAGNOSIS — M9902 Segmental and somatic dysfunction of thoracic region: Secondary | ICD-10-CM

## 2023-06-02 DIAGNOSIS — M9908 Segmental and somatic dysfunction of rib cage: Secondary | ICD-10-CM | POA: Diagnosis not present

## 2023-06-02 NOTE — Patient Instructions (Signed)
Enjoy your staycation Watch your stride length in bike position See you again in 7-8 weeks

## 2023-06-02 NOTE — Assessment & Plan Note (Signed)
Mild increase in exacerbation.  Patient though it has changed her working environment.  We discussed monitoring stride length as well as patient's positioning on the bike.  In addition to this patient has made great progress with weight lifting but could be some mild change in technique that could be contributing to some of the discomfort as well.  Increase activity slowly.  Follow-up again in 6 to 8 weeks

## 2023-06-02 NOTE — Progress Notes (Signed)
Allison [name] [address] [place] [address] Phone: [phone] Subjective:   Allison [name], am serving as a scribe for Dr. Antoine [name].  I'm seeing this patient by the request  of:  Allison Feeling, MD  CC: back and neck pain follow up   [id number]  Allison [name] is a 37 y.o. female coming in with complaint of back and neck pain Patient states. Same per usual. Discomfort L side hip. No new concerns. Has been lifting more   Medications patient has been prescribed:   Taking:         Reviewed prior external information including notes and imaging from previsou exam, outside providers and external EMR if available.   As well as notes that were available from care everywhere and other healthcare systems.  Past medical history, social, surgical and family history all reviewed in electronic medical record.  No pertanent information unless stated regarding to the chief complaint.   Past Medical History:  Diagnosis Date   Chicken pox    Heart murmur    History of lumbar compression fracture 06/27/2018   Compression fracture L2-L3 Dexa done 07/05/17 report in chart CT lumbar report from 04/22/17 in chart as well as xray from 04/19/17   Lactose intolerance 07/11/2018   Age 46   Osteoporosis, followed by Dr. Sharl Ma, thought to be due to Accutane 07/27/2017   Done density done 07/05/18 report in chart.    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 POSITIVE muscle aches  Objective  Blood pressure 106/62, pulse 76, height 5\' 6"  (1.676 m), weight 140 lb (63.5 kg), SpO2 99%.   General: No apparent distress alert and oriented x3 mood and affect normal, dressed appropriately.  HEENT: Pupils equal, extraocular movements intact  Respiratory: Patient's speak in full sentences and does not appear short of breath  Cardiovascular: No lower extremity edema, non tender, no erythema  Back exam showed the patient does have more tightness noted in the left sacroiliac joint.  Does have some tenderness to palpation in the paraspinal musculature.  Patient now has made improvement in strength in the core as well as the upper body strength.  Osteopathic findings  C2 flexed rotated and side bent right C6 flexed rotated and side bent left T3 extended rotated and side bent right inhaled rib T9 extended rotated and side bent left L3 flexed rotated and side bent left Sacrum left on left       Assessment and Plan:  SI (sacroiliac) joint dysfunction Mild increase in exacerbation.  Patient though it has changed her working environment.  We discussed monitoring stride length as well as patient's positioning on the bike.  In addition to this patient has made great progress with weight lifting but could be some mild change in technique that could be contributing to some of the discomfort as well.  Increase activity slowly.  Follow-up again in 6 to 8 weeks    Nonallopathic problems  Decision today to treat  with OMT was based on Physical Exam  After verbal consent patient was treated with HVLA, ME, FPR techniques in cervical, rib, thoracic, lumbar, and sacral  areas  Patient tolerated the procedure well with improvement in symptoms  Patient given exercises, stretches and lifestyle modifications  See medications in patient instructions if given  Patient will follow up in 4-8 weeks     The above documentation has been reviewed  and is accurate and complete Allison Saa, DO         Note: This dictation was prepared with Dragon dictation along with smaller phrase technology. Any transcriptional errors that result from this process are unintentional.

## 2023-07-27 NOTE — Progress Notes (Unsigned)
Allison [name]: [phone] Subjective:   Allison [name], am serving as a scribe for Dr. Antoine Primas.  I'm seeing this patient by the request  of:  Alla Feeling, MD  CC: Back and neck pain follow-up  [id number]  Allison [name] is a 37 y.o. female coming in with complaint of back and neck pain. OMT 06/02/2023. Patient states fell down some stairs about 3 weeks ago. Feeling a little more out of wack. Was tender over R gluteal region, but getting better. No other concerns.  Medications patient has been prescribed: None  Taking:         Reviewed prior external information including notes and imaging from previsou exam, outside providers and external EMR if available.   As well as notes that were available from care everywhere and other healthcare systems.  Past medical history, social, surgical and family history all reviewed in electronic medical record.  No pertanent information unless stated regarding to the chief complaint.   Past Medical History:  Diagnosis Date   Chicken pox    Heart murmur    History of lumbar compression fracture 06/27/2018   Compression fracture L2-L3 Dexa done 07/05/17 report in chart CT lumbar report from 04/22/17 in chart as well as xray from 04/19/17   Lactose intolerance 07/11/2018   Age 37   Osteoporosis, followed by Dr. Sharl Ma, thought to be due to Accutane 07/27/2017   Done density done 07/05/18 report in chart.    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 POSITIVE muscle aches  Objective  Blood pressure 102/64, pulse 83, height 5\' 6"  (1.676 m), weight 143 lb (64.9 kg), SpO2 98%.   General: No apparent distress alert and oriented x3 mood and affect normal, dressed appropriately.  HEENT: Pupils equal, extraocular movements intact  Respiratory: Patient's speak in full sentences and does not appear short of breath  Cardiovascular: No lower extremity edema, non tender, no erythema    Osteopathic findings  C2 flexed rotated and side bent right C6 flexed rotated and side bent left T3 extended rotated and side bent right inhaled rib T9 extended rotated and side bent left L2 flexed rotated and side bent right Sacrum right on right       Assessment and Plan:  SI (sacroiliac) joint dysfunction Recent fall noted, discussed with patient about icing regimen and home exercises.  Discussed which activities to do and which ones to avoid.  Increase activity slowly.  Discussed warm compresses as well as heat.  Follow-up again in 6 to 8 weeks    Nonallopathic problems  Decision today to treat with OMT was based on Physical Exam  After verbal consent patient was treated with HVLA, ME, FPR techniques in cervical, rib, thoracic, lumbar, and sacral  areas  Patient tolerated the procedure well with improvement in symptoms  Patient given exercises, stretches and lifestyle modifications  See medications in patient instructions if given  Patient will follow up  in 4-8 weeks     The above documentation has been reviewed and is accurate and complete Judi Saa, DO         Note: This dictation was prepared with Dragon dictation along with smaller phrase technology. Any transcriptional errors that result from this process are unintentional.

## 2023-07-28 ENCOUNTER — Encounter: Payer: Self-pay | Admitting: Family Medicine

## 2023-07-28 ENCOUNTER — Ambulatory Visit (INDEPENDENT_AMBULATORY_CARE_PROVIDER_SITE_OTHER): Payer: Federal, State, Local not specified - PPO | Admitting: Family Medicine

## 2023-07-28 VITALS — BP 102/64 | HR 83 | Ht 66.0 in | Wt 143.0 lb

## 2023-07-28 DIAGNOSIS — M9902 Segmental and somatic dysfunction of thoracic region: Secondary | ICD-10-CM | POA: Diagnosis not present

## 2023-07-28 DIAGNOSIS — M9901 Segmental and somatic dysfunction of cervical region: Secondary | ICD-10-CM

## 2023-07-28 DIAGNOSIS — M533 Sacrococcygeal disorders, not elsewhere classified: Secondary | ICD-10-CM

## 2023-07-28 DIAGNOSIS — M9904 Segmental and somatic dysfunction of sacral region: Secondary | ICD-10-CM | POA: Diagnosis not present

## 2023-07-28 DIAGNOSIS — M9908 Segmental and somatic dysfunction of rib cage: Secondary | ICD-10-CM

## 2023-07-28 DIAGNOSIS — M9903 Segmental and somatic dysfunction of lumbar region: Secondary | ICD-10-CM

## 2023-07-28 NOTE — Assessment & Plan Note (Addendum)
Recent fall noted, discussed with patient about icing regimen and home exercises.  Discussed which activities to do and which ones to avoid.  Increase activity slowly.  Discussed warm compresses as well as heat.  Follow-up again in 6 to 8 weeks

## 2023-07-28 NOTE — Patient Instructions (Signed)
Good to see you Warm compress and massage See me again in 6 weeks

## 2023-09-14 NOTE — Progress Notes (Unsigned)
[name] [address] [place] [address] Phone: [phone] Subjective:   Bruce Donath, am serving as a scribe for Dr. Antoine Primas.  I'm seeing this patient by the request  of:  Alla Feeling, MD  CC: back and neck pain follow up   [id number]  Allison [name] is a 37 y.o. female coming in with complaint of back and neck pain. OMT 07/28/2023. Patient states that she has some pain in T spine and R clavicle.   Medications patient has been prescribed: Zanaflex  Taking:         Past Medical History:  Diagnosis Date   Chicken pox    Heart murmur    History of lumbar compression fracture 06/27/2018   Compression fracture L2-L3 Dexa done 07/05/17 report in chart CT lumbar report from 04/22/17 in chart as well as xray from 04/19/17   Lactose intolerance 07/11/2018   Age 47   Osteoporosis, followed by Dr. Sharl Ma, thought to be due to Accutane 07/27/2017   Done density done 07/05/18 report in chart.    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 POSITIVE muscle aches  Objective  Blood pressure 108/72, pulse 76, height 5\' 6"  (1.676 m), weight 141 lb (64 kg), SpO2 96%.   General: No apparent distress alert and oriented x3 mood and affect normal, dressed appropriately.  HEENT: Pupils equal, extraocular movements intact  Respiratory: Patient's speak in full sentences and does not appear short of breath  Cardiovascular: No  lower extremity edema, non tender, no erythema  Tightness noted still in the right parascapular area.  Patient does have just some mild limitation in sidebending to the right compared to the left.  Negative Spurling's.  Some tightness in the lower back which is a little bit more than usual.  Osteopathic findings  C2 flexed rotated and side bent right C6 flexed rotated and side bent right T3 extended rotated and side bent right inhaled rib T9 extended rotated and side bent right L2 flexed rotated and side bent right Sacrum right on right       Assessment and Plan:  Slipped rib syndrome Chronic, with mild worsening symptoms.  Discussed icing regimen and home exercises, discussed avoiding certain activities.  Discussed icing regimen.  Increase activity slowly.  She is doing things the patient is having some mild ulnar neuropathy so I do think patient would respond well to limiting some of her range of motion with her lifting.  Follow-up again in 6 to 8 weeks    Nonallopathic problems  Decision today to treat with OMT was based on Physical Exam  After verbal consent patient was treated with HVLA, ME, FPR techniques in cervical, rib, thoracic, lumbar, and sacral  areas  Patient tolerated the procedure well with improvement in symptoms  Patient given exercises, stretches and lifestyle modifications  See medications in patient instructions if given  Patient will follow up in 4-8 weeks     The above documentation has been reviewed  and is accurate and complete Judi Saa, DO         Note: This dictation was prepared with Dragon dictation along with smaller phrase technology. Any transcriptional errors that result from this process are unintentional.

## 2023-09-15 ENCOUNTER — Encounter: Payer: Self-pay | Admitting: Family Medicine

## 2023-09-15 ENCOUNTER — Ambulatory Visit (INDEPENDENT_AMBULATORY_CARE_PROVIDER_SITE_OTHER): Payer: Federal, State, Local not specified - PPO | Admitting: Family Medicine

## 2023-09-15 VITALS — BP 108/72 | HR 76 | Ht 66.0 in | Wt 141.0 lb

## 2023-09-15 DIAGNOSIS — M9903 Segmental and somatic dysfunction of lumbar region: Secondary | ICD-10-CM

## 2023-09-15 DIAGNOSIS — M9901 Segmental and somatic dysfunction of cervical region: Secondary | ICD-10-CM | POA: Diagnosis not present

## 2023-09-15 DIAGNOSIS — M9902 Segmental and somatic dysfunction of thoracic region: Secondary | ICD-10-CM | POA: Diagnosis not present

## 2023-09-15 DIAGNOSIS — M9904 Segmental and somatic dysfunction of sacral region: Secondary | ICD-10-CM | POA: Diagnosis not present

## 2023-09-15 DIAGNOSIS — M9908 Segmental and somatic dysfunction of rib cage: Secondary | ICD-10-CM

## 2023-09-15 DIAGNOSIS — M940 Chondrocostal junction syndrome [Tietze]: Secondary | ICD-10-CM | POA: Diagnosis not present

## 2023-09-15 NOTE — Patient Instructions (Signed)
Good to see you! Good luck with carrots and goat cheese See you again in 6-8 weeks

## 2023-09-15 NOTE — Assessment & Plan Note (Signed)
Chronic, with mild worsening symptoms.  Discussed icing regimen and home exercises, discussed avoiding certain activities.  Discussed icing regimen.  Increase activity slowly.  She is doing things the patient is having some mild ulnar neuropathy so I do think patient would respond well to limiting some of her range of motion with her lifting.  Follow-up again in 6 to 8 weeks

## 2023-11-01 NOTE — Progress Notes (Signed)
[name] [address] [place] [address] Phone: [phone] Subjective:   INadine Counts, am serving as a scribe for Dr. Antoine Primas.  I'm seeing this patient by the request  of:  Alla Feeling, MD  CC: Neck and back pain follow-up  [id number]  Allison [name] is a 37 y.o. female coming in with complaint of back and neck pain. OMT on 09/15/2023. Patient states same per usual. No new concerns.  Overall still some stiffness but nothing significant.  Has been fairly active.  Is going to start doing more contrast therapy and is hopeful that it will make some improvement.           Reviewed prior external information including notes and imaging from previsou exam, outside providers and external EMR if available.   As well as notes that were available from care everywhere and other healthcare systems.  Past medical history, social, surgical and family history all reviewed in electronic medical record.  No pertanent information unless stated regarding to the chief complaint.   Past Medical History:  Diagnosis Date   Chicken pox    Heart murmur    History of lumbar compression fracture 06/27/2018   Compression fracture L2-L3 Dexa done 07/05/17 report in chart CT lumbar report from 04/22/17 in chart as well as xray from 04/19/17   Lactose intolerance 07/11/2018   Age 14   Osteoporosis, followed by Dr. Sharl Ma, thought to be due to Accutane 07/27/2017   Done density done 07/05/18 report in chart.    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 POSITIVE muscle aches  Objective  Blood pressure 112/70, pulse 77, height 5\' 6"  (1.676 m), weight 140 lb (63.5 kg), SpO2 98%.   General: No apparent distress alert and oriented x3 mood and affect normal, dressed appropriately.  HEENT: Pupils equal, extraocular movements intact  Respiratory: Patient's speak in full sentences and does not appear short of breath  Cardiovascular: No lower extremity edema, non tender, no erythema  Tightness more in the upper back noted today.  Some tightness noted with FABER test minorly right greater than left.  Patient does have some difficulty or tenderness in the paraspinal musculature of the lumbar spine.  Osteopathic findings  C2 flexed rotated and side bent right C6 flexed rotated and side bent left T5 extended rotated and side bent right inhaled rib L2 flexed rotated and side bent right L3 flexed rotated and side bent left Sacrum right on right       Assessment and Plan:  Slipped rib syndrome We discussed icing regimen and home exercises, which activities to do and which ones to avoid.  Increase activity slowly otherwise.  Follow-up with me again in 6 to 8 weeks.  Overall doing relatively well.    Nonallopathic problems  Decision today to treat with OMT was based on Physical Exam  After verbal consent patient was treated with HVLA, ME, FPR techniques in cervical, rib, thoracic, lumbar, and sacral  areas  Patient tolerated the procedure well with improvement in symptoms  Patient given exercises, stretches and lifestyle modifications  See medications in patient instructions if given  Patient will follow up in 4-8 weeks    The above documentation has been reviewed and is accurate and complete Judi Saa, DO          Note: This dictation was prepared with Dragon dictation along with smaller phrase technology.  Any transcriptional errors that result from this process are unintentional.

## 2023-11-02 ENCOUNTER — Ambulatory Visit: Payer: Federal, State, Local not specified - PPO | Admitting: Family Medicine

## 2023-11-02 ENCOUNTER — Encounter: Payer: Self-pay | Admitting: Family Medicine

## 2023-11-02 VITALS — BP 112/70 | HR 77 | Ht 66.0 in | Wt 140.0 lb

## 2023-11-02 DIAGNOSIS — M9904 Segmental and somatic dysfunction of sacral region: Secondary | ICD-10-CM | POA: Diagnosis not present

## 2023-11-02 DIAGNOSIS — M9903 Segmental and somatic dysfunction of lumbar region: Secondary | ICD-10-CM

## 2023-11-02 DIAGNOSIS — M940 Chondrocostal junction syndrome [Tietze]: Secondary | ICD-10-CM | POA: Diagnosis not present

## 2023-11-02 DIAGNOSIS — M9902 Segmental and somatic dysfunction of thoracic region: Secondary | ICD-10-CM

## 2023-11-02 DIAGNOSIS — M9908 Segmental and somatic dysfunction of rib cage: Secondary | ICD-10-CM

## 2023-11-02 DIAGNOSIS — M9901 Segmental and somatic dysfunction of cervical region: Secondary | ICD-10-CM

## 2023-11-02 NOTE — Patient Instructions (Signed)
Good to see you! Can' t wait to see daughters reaction to present See you again in 6 weeks

## 2023-11-02 NOTE — Assessment & Plan Note (Signed)
We discussed icing regimen and home exercises, which activities to do and which ones to avoid.  Increase activity slowly otherwise.  Follow-up with me again in 6 to 8 weeks.  Overall doing relatively well.

## 2023-12-21 NOTE — Progress Notes (Signed)
 [name] [address] [place] [address] Phone: [phone] Subjective:   Allison [name], am serving as a scribe for Dr. Arthea Claudene.  I'm seeing this patient by the request  of:  Alton Sor, PA-C  CC: back and hip pain   YEP:[id number]  Allison [name] is a 38 y.o. female coming in with complaint of back and neck pain. OMT 11/02/23. Patient states does have some tightness noted.  Seems to be more on the left hip than anywhere else at the moment.  Discussed icing regimen and home exercise, discussed which activities to do and which ones to avoid  Medications patient has been prescribed: Zanaflex   Taking:         Reviewed prior external information including notes and imaging from previsou exam, outside providers and external EMR if available.   As well as notes that were available from care everywhere and other healthcare systems.  Past medical history, social, surgical and family history all reviewed in electronic medical record.  No pertanent information unless stated regarding to the chief complaint.   Past Medical History:  Diagnosis Date   Chicken pox    Heart murmur    History of lumbar compression fracture 06/27/2018   Compression fracture L2-L3 Dexa done 07/05/17 report in chart CT lumbar report from 04/22/17 in chart as well as xray from 04/19/17   Lactose intolerance 07/11/2018   Age 51   Osteoporosis, followed by Dr. Faythe, thought to be due to Accutane 07/27/2017   Done density done 07/05/18 report in chart.    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 POSITIVE muscle aches, body aches  Objective  Blood pressure 102/60, pulse 94, height 5' 6 (1.676 m), weight 138 lb (62.6 kg), SpO2 98%.   General: No apparent distress alert and oriented x3 mood and affect normal, dressed appropriately.  HEENT: Pupils equal, extraocular movements intact  Respiratory: Patient's speak in full sentences and does not appear short of breath  Cardiovascular: No lower extremity edema, non tender, no erythema  Gait relatively normal MSK:  Back does have some loss lordosis noted.  Some tenderness to palpation noted.  Patient does seem more stressed than usual.  Osteopathic findings  C3 flexed rotated and side bent right C4 flexed rotated and side bent left C6 flexed rotated and side bent left T3 extended rotated and side bent right inhaled rib T7 extended rotated and side bent left inhaled rib L1 flexed rotated and side bent right Sacrum right on right       Assessment and Plan:  Slipped rib syndrome Unfortunately recent exacerbation.  Has been seen to have more stress recently.  Discussed with patient about icing regimen and home exercises, which activities to do and which ones to avoid.  Increase activity slowly otherwise.  Follow-up again in 6 to 8 weeks.    Nonallopathic problems  Decision today to treat with OMT was based on Physical Exam  After verbal consent patient was treated  with HVLA, ME, FPR techniques in cervical, rib, thoracic, lumbar, and sacral  areas  Patient tolerated the procedure well with improvement in symptoms  Patient given exercises, stretches and lifestyle modifications  See medications in patient instructions if given  Patient will follow up in 4-8 weeks    The above documentation has been reviewed and is accurate and complete Allison Bur M Brynnly Bonet, DO          Note:  This dictation was prepared with Dragon dictation along with smaller phrase technology. Any transcriptional errors that result from this process are unintentional.

## 2023-12-22 ENCOUNTER — Encounter: Payer: Self-pay | Admitting: Family Medicine

## 2023-12-22 ENCOUNTER — Ambulatory Visit (INDEPENDENT_AMBULATORY_CARE_PROVIDER_SITE_OTHER): Payer: Federal, State, Local not specified - PPO | Admitting: Family Medicine

## 2023-12-22 VITALS — BP 102/60 | HR 94 | Ht 66.0 in | Wt 138.0 lb

## 2023-12-22 DIAGNOSIS — M9908 Segmental and somatic dysfunction of rib cage: Secondary | ICD-10-CM | POA: Diagnosis not present

## 2023-12-22 DIAGNOSIS — M9901 Segmental and somatic dysfunction of cervical region: Secondary | ICD-10-CM | POA: Diagnosis not present

## 2023-12-22 DIAGNOSIS — M940 Chondrocostal junction syndrome [Tietze]: Secondary | ICD-10-CM

## 2023-12-22 DIAGNOSIS — M9902 Segmental and somatic dysfunction of thoracic region: Secondary | ICD-10-CM | POA: Diagnosis not present

## 2023-12-22 DIAGNOSIS — M9904 Segmental and somatic dysfunction of sacral region: Secondary | ICD-10-CM

## 2023-12-22 DIAGNOSIS — M9903 Segmental and somatic dysfunction of lumbar region: Secondary | ICD-10-CM

## 2023-12-22 NOTE — Patient Instructions (Signed)
 Good to see you! Do meloxicam  for 5 days Ice and Voltaren  Keep working out See you again in 5 weeks

## 2023-12-22 NOTE — Assessment & Plan Note (Signed)
 Unfortunately recent exacerbation.  Has been seen to have more stress recently.  Discussed with patient about icing regimen and home exercises, which activities to do and which ones to avoid.  Increase activity slowly otherwise.  Follow-up again in 6 to 8 weeks.

## 2024-01-25 NOTE — Progress Notes (Unsigned)
 [name] [address] [place] [address] Phone: [phone] Subjective:   Allison [name] am a scribe for Dr. Katrinka Blazing.   I'm seeing this patient by the request  of:  Eulah Pont, PA-C  CC: Back pain follow-up  [id number]  Allison [name] is a 38 y.o. female coming in with complaint of back and neck pain. Patient states it has flared up since the last time she was here. It is going down the left shoulder a little bit.           Reviewed prior external information including notes and imaging from previsou exam, outside providers and external EMR if available.   As well as notes that were available from care everywhere and other healthcare systems.  Past medical history, social, surgical and family history all reviewed in electronic medical record.  No pertanent information unless stated regarding to the chief complaint.   Past Medical History:  Diagnosis Date   Chicken pox    Heart murmur    History of lumbar compression fracture 06/27/2018   Compression fracture L2-L3 Dexa done 07/05/17 report in chart CT lumbar report from 04/22/17 in chart as well as xray from 04/19/17   Lactose intolerance 07/11/2018   Age 61   Osteoporosis, followed by Dr. Sharl Ma, thought to be due to Accutane 07/27/2017   Done density done 07/05/18 report in chart.    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 POSITIVE muscle aches  Objective   Blood pressure 102/70, pulse 70, height 5\' 6"  (1.676 m), weight 136 lb 6.4 oz (61.9 kg), SpO2 99%.   General: No apparent distress alert and oriented x3 mood and affect normal, dressed appropriately.  HEENT: Pupils equal, extraocular movements intact  Respiratory: Patient's speak in full sentences and does not appear short of breath  Cardiovascular: No lower extremity edema, non tender, no erythema  Gait MSK:  Back does have some loss lordosis noted.  Still has some tightness noted in the paraspinal musculature.  Patient though is able to do all activities.  Still a significant improvement in hip abductor and core strength from previous exams.  Osteopathic findings  C2 flexed rotated and side bent right C7 flexed rotated and side bent left T3 extended rotated and side bent right inhaled rib T8 extended rotated and side bent left L2 flexed rotated and side bent right Sacrum right on right       Assessment and Plan:  SI (sacroiliac) joint dysfunction Continues to have some.  Discussed icing regimen and home exercises, which activities to do and which ones to avoid.  Increase activity slowly.  Discussed icing regimen.  Increase activity as tolerated.  Would like to see patient again in 4 to 6 weeks with patient having some more difficulty recently.  Patient is in agreement with the plan.    Nonallopathic problems  Decision today to treat with OMT was based on Physical Exam  After verbal consent patient was treated with HVLA, ME,  FPR techniques in cervical, rib, thoracic, lumbar, and sacral  areas  Patient tolerated the procedure well with improvement in symptoms  Patient given exercises, stretches and lifestyle modifications  See medications in patient instructions if given  Patient will follow up in 4-8 weeks    The above documentation has been reviewed and is accurate and complete Judi Saa, DO          Note: This dictation was prepared with Dragon dictation  along with smaller phrase technology. Any transcriptional errors that result from this process are unintentional.

## 2024-01-26 ENCOUNTER — Encounter: Payer: Self-pay | Admitting: Family Medicine

## 2024-01-26 ENCOUNTER — Ambulatory Visit: Payer: Federal, State, Local not specified - PPO | Admitting: Family Medicine

## 2024-01-26 VITALS — BP 102/70 | HR 70 | Ht 66.0 in | Wt 136.4 lb

## 2024-01-26 DIAGNOSIS — M9903 Segmental and somatic dysfunction of lumbar region: Secondary | ICD-10-CM | POA: Diagnosis not present

## 2024-01-26 DIAGNOSIS — M9902 Segmental and somatic dysfunction of thoracic region: Secondary | ICD-10-CM | POA: Diagnosis not present

## 2024-01-26 DIAGNOSIS — M9904 Segmental and somatic dysfunction of sacral region: Secondary | ICD-10-CM | POA: Diagnosis not present

## 2024-01-26 DIAGNOSIS — M9901 Segmental and somatic dysfunction of cervical region: Secondary | ICD-10-CM | POA: Diagnosis not present

## 2024-01-26 DIAGNOSIS — M533 Sacrococcygeal disorders, not elsewhere classified: Secondary | ICD-10-CM | POA: Diagnosis not present

## 2024-01-26 DIAGNOSIS — M9908 Segmental and somatic dysfunction of rib cage: Secondary | ICD-10-CM

## 2024-01-26 DIAGNOSIS — M810 Age-related osteoporosis without current pathological fracture: Secondary | ICD-10-CM

## 2024-01-26 NOTE — Assessment & Plan Note (Signed)
 Patient will be following up with her endocrinologist.  We did discuss the potential of the last or did make the recommendation of getting a another bone density in 1 year.

## 2024-01-26 NOTE — Patient Instructions (Addendum)
 Good to see you. You are great. Always remember that! Return in 4 to 8 weeks. Whatever works best for you.

## 2024-01-26 NOTE — Assessment & Plan Note (Signed)
 Continues to have some.  Discussed icing regimen and home exercises, which activities to do and which ones to avoid.  Increase activity slowly.  Discussed icing regimen.  Increase activity as tolerated.  Would like to see patient again in 4 to 6 weeks with patient having some more difficulty recently.  Patient is in agreement with the plan.

## 2024-03-14 NOTE — Progress Notes (Unsigned)
 [facility] [address] [place] [address] Phone: [phone] Subjective:   Allison [name], am serving as a scribe for Dr. Ronnell Coins.  I'm seeing this patient by the request  of:  Susann Eon, PA-C  CC: back and neck pain follow up   [id number]  Allison [name] is a 38 y.o. female coming in with complaint of back and neck pain. OMT 01/26/2024. Patient states that she is tight throughout her back.   Also co pain in L lateral epi. Also has pain in palm of hand intermittently. Sometimes has tight hand and is hard to make a fist without pain. Notices she compensates for elbow with her L shoulder/scapula.   Medications patient has been prescribed: None          Reviewed prior external information including notes and imaging from previsou exam, outside providers and external EMR if available.   As well as notes that were available from care everywhere and other healthcare systems.  Past medical history, social, surgical and family history all reviewed in electronic medical record.  No pertanent information unless stated regarding to the chief complaint.   Past Medical History:  Diagnosis Date   Chicken pox    Heart murmur    History of lumbar compression fracture 06/27/2018   Compression fracture L2-L3 Dexa done 07/05/17 report in chart CT lumbar report from 04/22/17 in chart as well as xray from 04/19/17   Lactose intolerance 07/11/2018   Age 63   Osteoporosis, followed by Dr. Kathyanne Parkers, thought to be due to Accutane 07/27/2017   Done density done 07/05/18 report in chart.    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 POSITIVE muscle aches  Objective  Blood pressure 104/74, pulse 66, height 5\' 6"  (1.676 m), weight 135 lb (61.2 kg), SpO2 98%.   General: No apparent distress alert and oriented x3 mood and affect normal, dressed appropriately.  HEENT: Pupils equal, extraocular movements intact  Respiratory: Patient's speak in full sentences and does not appear short of breath  Cardiovascular: No lower extremity edema, non tender, no erythema  Gait MSK:  Back does have some loss lordosis noted.  Some tightness noted more on the left than the right sacroiliac joint  Left elbow exam does have tenderness to palpation over the lateral epicondylar area.  Worsening pain with extension noted of the wrist.  Osteopathic findings  C2 flexed rotated and side bent right C6 flexed rotated and side bent left T3 extended rotated and side bent right inhaled rib T7 extended rotated and side bent left L2 flexed rotated and side bent right Sacrum left on left   Limited muscular skeletal ultrasound was performed and interpreted by Ronnell Coins, M  Limited ultrasound of patient's left elbow does show some hypoechoic changes and some increasing in Doppler flow.  No true tear noted.      Assessment and Plan:  SI (sacroiliac) joint dysfunction Chronic, left side.  Discussed icing regimen of home exercises,  discussed which activities to do things to avoid.  Increase activity slowly.  Discussed icing regimen.  Follow-up again in 6 to 8 weeks.  Left lateral epicondylitis Elbow anatomy was reviewed, and tendinopathy was explained.  Pt. given a home rehab program. Start with isometrics and ROM, then a series of concentric and eccentric exercises should be done starting with no weight, work up to 1 lb, hammer,  etc.  Use counterforce strap if working or using hands.  Formal PT would be beneficial. Emphasized stretching an cross-friction massage Emphasized proper palms up lifting biomechanics to unload ECRB Follow-up again in 6 weeks    Nonallopathic problems  Decision today to treat with OMT was based on Physical Exam  After verbal consent patient was treated with HVLA, ME, FPR techniques in cervical, rib, thoracic, lumbar, and sacral  areas  Patient tolerated the procedure well with improvement in symptoms  Patient given exercises, stretches and lifestyle modifications  See medications in patient instructions if given  Patient will follow up in 4-8 weeks    The above documentation has been reviewed and is accurate and complete Allison Holton M Kathrynn Backstrom, DO          Note: This dictation was prepared with Dragon dictation along with smaller phrase technology. Any transcriptional errors that result from this process are unintentional.

## 2024-03-15 ENCOUNTER — Other Ambulatory Visit: Payer: Self-pay

## 2024-03-15 ENCOUNTER — Ambulatory Visit: Admitting: Family Medicine

## 2024-03-15 ENCOUNTER — Encounter: Payer: Self-pay | Admitting: Family Medicine

## 2024-03-15 VITALS — BP 104/74 | HR 66 | Ht 66.0 in | Wt 135.0 lb

## 2024-03-15 DIAGNOSIS — M7712 Lateral epicondylitis, left elbow: Secondary | ICD-10-CM | POA: Diagnosis not present

## 2024-03-15 DIAGNOSIS — M9903 Segmental and somatic dysfunction of lumbar region: Secondary | ICD-10-CM | POA: Diagnosis not present

## 2024-03-15 DIAGNOSIS — M9904 Segmental and somatic dysfunction of sacral region: Secondary | ICD-10-CM

## 2024-03-15 DIAGNOSIS — M9901 Segmental and somatic dysfunction of cervical region: Secondary | ICD-10-CM

## 2024-03-15 DIAGNOSIS — M25522 Pain in left elbow: Secondary | ICD-10-CM

## 2024-03-15 DIAGNOSIS — M533 Sacrococcygeal disorders, not elsewhere classified: Secondary | ICD-10-CM

## 2024-03-15 DIAGNOSIS — M9902 Segmental and somatic dysfunction of thoracic region: Secondary | ICD-10-CM | POA: Diagnosis not present

## 2024-03-15 DIAGNOSIS — M9908 Segmental and somatic dysfunction of rib cage: Secondary | ICD-10-CM

## 2024-03-15 NOTE — Assessment & Plan Note (Signed)
Elbow anatomy was reviewed, and tendinopathy was explained.  Pt. given a home rehab program. Start with isometrics and ROM, then a series of concentric and eccentric exercises should be done starting with no weight, work up to 1 lb, hammer, etc.  Use counterforce strap if working or using hands.  Formal PT would be beneficial. Emphasized stretching an cross-friction massage Emphasized proper palms up lifting biomechanics to unload ECRB Follow-up again in 6 weeks

## 2024-03-15 NOTE — Patient Instructions (Signed)
 No overhand lifting See me 6-8 weeks

## 2024-03-15 NOTE — Assessment & Plan Note (Signed)
 Chronic, left side.  Discussed icing regimen of home exercises, discussed which activities to do things to avoid.  Increase activity slowly.  Discussed icing regimen.  Follow-up again in 6 to 8 weeks.

## 2024-05-04 NOTE — Progress Notes (Unsigned)
  [facility] [address] [place] [address] Phone: [phone] Subjective:    I'm seeing this patient by the request  of:  Susann Eon, PA-C  CC:   [id number]  Allison [name] is a 38 y.o. female coming in with complaint of back and neck pain. OMT 03/15/2024. Patient states   Medications patient has been prescribed: None  Taking:         Reviewed prior external information including notes and imaging from previsou exam, outside providers and external EMR if available.   As well as notes that were available from care everywhere and other healthcare systems.  Past medical history, social, surgical and family history all reviewed in electronic medical record.  No pertanent information unless stated regarding to the chief complaint.   Past Medical History:  Diagnosis Date   Chicken pox    Heart murmur    History of lumbar compression fracture 06/27/2018   Compression fracture L2-L3 Dexa done 07/05/17 report in chart CT lumbar report from 04/22/17 in chart as well as xray from 04/19/17   Lactose intolerance 07/11/2018   Age 89   Osteoporosis, followed by Dr. Kathyanne Parkers, thought to be due to Accutane 07/27/2017   Done density done 07/05/18 report in chart.    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 POSITIVE muscle aches  Objective  There were no vitals taken for this visit.   General: No apparent distress alert and  oriented x3 mood and affect normal, dressed appropriately.  HEENT: Pupils equal, extraocular movements intact  Respiratory: Patient's speak in full sentences and does not appear short of breath  Cardiovascular: No lower extremity edema, non tender, no erythema  Gait MSK:  Back   Osteopathic findings  C2 flexed rotated and side bent right C6 flexed rotated and side bent left T3 extended rotated and side bent right inhaled rib T9 extended rotated and side bent left L2 flexed rotated and side bent right Sacrum right on right       Assessment and Plan:  No problem-specific Assessment & Plan notes found for this encounter.    Nonallopathic problems  Decision today to treat with OMT was based on Physical Exam  After verbal consent patient was treated with HVLA, ME, FPR techniques in cervical, rib, thoracic, lumbar, and sacral  areas  Patient tolerated the procedure well with improvement in symptoms  Patient given exercises, stretches and lifestyle modifications  See medications in patient instructions if given  Patient will follow up in 4-8 weeks             Note: This dictation was prepared with Dragon dictation along with smaller phrase technology. Any transcriptional errors that result from this process are unintentional.

## 2024-05-10 ENCOUNTER — Encounter: Payer: Self-pay | Admitting: Family Medicine

## 2024-05-10 ENCOUNTER — Ambulatory Visit: Admitting: Family Medicine

## 2024-05-10 VITALS — BP 108/80 | HR 67 | Ht 66.0 in | Wt 132.0 lb

## 2024-05-10 DIAGNOSIS — M9901 Segmental and somatic dysfunction of cervical region: Secondary | ICD-10-CM

## 2024-05-10 DIAGNOSIS — M9904 Segmental and somatic dysfunction of sacral region: Secondary | ICD-10-CM | POA: Diagnosis not present

## 2024-05-10 DIAGNOSIS — M9903 Segmental and somatic dysfunction of lumbar region: Secondary | ICD-10-CM | POA: Diagnosis not present

## 2024-05-10 DIAGNOSIS — M9902 Segmental and somatic dysfunction of thoracic region: Secondary | ICD-10-CM

## 2024-05-10 DIAGNOSIS — M940 Chondrocostal junction syndrome [Tietze]: Secondary | ICD-10-CM

## 2024-05-10 DIAGNOSIS — M9908 Segmental and somatic dysfunction of rib cage: Secondary | ICD-10-CM | POA: Diagnosis not present

## 2024-05-10 NOTE — Assessment & Plan Note (Signed)
 Slipper noted again right side greater than left.  Continue to respond well to osteopathic manipulation.  Some tightness noted in the sacroiliac joint.  I discussed with patient about different seating mechanics and ergonomics throughout the day.  Sometimes seems to be crossing her legs that I think is potentially contributing to some muscle imbalance.  Follow-up again in 6 to 8 weeks

## 2024-05-10 NOTE — Patient Instructions (Signed)
 Great to see you Do the ball trick See me in 7-8 weeks

## 2024-07-05 NOTE — Progress Notes (Unsigned)
 [facility] [address] [place] [address] Phone: [phone] Subjective:   Allison [name], am serving as a scribe for Dr. Arthea Claudene.  I'm seeing this patient by the request  of:  Alton Sor, PA-C  CC: back and neck pain follow up   YEP:Dlagzrupcz  Allison [name] is a 38 y.o. female coming in with complaint of back and neck pain. OMT 05/10/2024. Patient states continues to have difficulty as well.  Still has some discomfort.  Still lifting on a more regular basis.  Lifting a little heavier and has noticed some mild increase in upper back pain. Medications patient has been prescribed: None  Taking:         Reviewed prior external information including notes and imaging from previsou exam, outside providers and external EMR if available.   As well as notes that were available from care everywhere and other healthcare systems.  Past medical history, social, surgical and family history all reviewed in electronic medical record.  No pertanent information unless stated regarding to the chief complaint.   Past Medical History:  Diagnosis Date   Chicken pox    Heart murmur    History of lumbar compression fracture 06/27/2018   Compression fracture L2-L3 Dexa done 07/05/17 report in chart CT lumbar report from 04/22/17 in chart as well as xray from 04/19/17   Lactose intolerance 07/11/2018   Age 72   Osteoporosis, followed by Dr. Faythe, thought to be due to Accutane 07/27/2017   Done density done 07/05/18 report in chart.    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 POSITIVE muscle aches  Objective  Blood pressure 118/88, pulse 89, height 5' 6 (1.676 m), weight 141 lb (64 kg), SpO2 97%.   General: No apparent distress alert and oriented x3 mood and affect normal, dressed appropriately.  HEENT: Pupils equal, extraocular movements intact  Respiratory: Patient's speak in full sentences and does not appear short of breath  Cardiovascular: No lower extremity edema, non tender, no erythema  MSK:  Back does have some loss of lordosis noted.  Some tenderness to palpation noted.  Negative straight leg test noted.  Osteopathic findings  C2 flexed rotated and side bent right C6 flexed rotated and side bent left T3 extended rotated and side bent right inhaled rib T11 extended rotated and side bent left L1 flexed rotated and side bent right Sacrum right on right     Assessment and Plan:  Slipped rib syndrome Relatively stable overall.  Doing very well with the lifting.  Discussed some lifting mechanics including exhalation with lifting.  Other than that continue with conservative therapy.  Follow-up again in 6 weeks    Nonallopathic problems  Decision today to treat with OMT was based on Physical Exam  After verbal consent patient was treated with HVLA, ME, FPR techniques in cervical, rib, thoracic, lumbar, and sacral  areas  Patient tolerated the procedure well with improvement in symptoms  Patient given exercises, stretches  and lifestyle modifications  See medications in patient instructions if given  Patient will follow up in 4-8 weeks    The above documentation has been reviewed and is accurate and complete Allison Racette M Cincere Deprey, DO          Note: This dictation was prepared with Dragon dictation along with smaller phrase technology. Any transcriptional errors that result from this process are  unintentional.

## 2024-07-06 ENCOUNTER — Encounter: Payer: Self-pay | Admitting: Family Medicine

## 2024-07-06 ENCOUNTER — Ambulatory Visit: Admitting: Family Medicine

## 2024-07-06 VITALS — BP 118/88 | HR 89 | Ht 66.0 in | Wt 141.0 lb

## 2024-07-06 DIAGNOSIS — M9901 Segmental and somatic dysfunction of cervical region: Secondary | ICD-10-CM

## 2024-07-06 DIAGNOSIS — M9908 Segmental and somatic dysfunction of rib cage: Secondary | ICD-10-CM

## 2024-07-06 DIAGNOSIS — M940 Chondrocostal junction syndrome [Tietze]: Secondary | ICD-10-CM | POA: Diagnosis not present

## 2024-07-06 DIAGNOSIS — M9903 Segmental and somatic dysfunction of lumbar region: Secondary | ICD-10-CM

## 2024-07-06 DIAGNOSIS — M9904 Segmental and somatic dysfunction of sacral region: Secondary | ICD-10-CM

## 2024-07-06 DIAGNOSIS — M9902 Segmental and somatic dysfunction of thoracic region: Secondary | ICD-10-CM

## 2024-07-06 NOTE — Patient Instructions (Signed)
 Thanks for making me laugh See me in 6-8 weeks

## 2024-07-06 NOTE — Assessment & Plan Note (Addendum)
 Relatively stable overall.  Doing very well with the lifting.  Discussed some lifting mechanics including exhalation with lifting.  Other than that continue with conservative therapy.  Follow-up again in 6 weeks

## 2024-08-29 NOTE — Progress Notes (Unsigned)
 [name] [address] [place] [address] Phone: [phone] Subjective:   Allison [name] Allison [name], am serving as a scribe for Dr. Arthea Claudene.  I'm seeing this patient by the request  of:  Alton Sor, PA-C  CC: Multiple joint complaints but mostly back pain  YEP:Dlagzrupcz  Allison [name] is a 38 y.o. female coming in with complaint of back and neck pain. OMT 07/06/2024. Patient states that she is ready for manipulation.   L ankle pain over lateral aspect near base of the 5th. Aware of ankle when she walks for exercise. Pain occurs after walks.   Medications patient has been prescribed: None  Taking:         Reviewed prior external information including notes and imaging from previsou exam, outside providers and external EMR if available.   As well as notes that were available from care everywhere and other healthcare systems.  Past medical history, social, surgical and family history all reviewed in electronic medical record.  No pertanent information unless stated regarding to the chief complaint.   Past Medical History:  Diagnosis Date   Chicken pox    Heart murmur    History of lumbar compression fracture 06/27/2018   Compression fracture L2-L3 Dexa done 07/05/17 report in chart CT lumbar report from 04/22/17 in chart as well as xray from 04/19/17   Lactose intolerance 07/11/2018   Age 25   Osteoporosis, followed by Dr. Faythe, thought to be due to Accutane 07/27/2017   Done density done 07/05/18 report in chart.    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 POSITIVE muscle aches  Objective  Blood pressure 120/84, pulse 89, height 5' 6 (1.676 m), weight 139 lb (63 kg), SpO2 98%.   General: No apparent distress alert and oriented x3 mood and affect normal, dressed appropriately.  HEENT: Pupils equal, extraocular movements intact  Respiratory: Patient's speak in full sentences and does not appear short of breath  Cardiovascular: No lower extremity edema, non tender, no erythema  Gait relatively normal MSK:  Back mild loss lordosis noted.  Does have improvement on core strength noted.  Osteopathic findings  C2 flexed rotated and side bent right C6 flexed rotated and side bent left T3 extended rotated and side bent right inhaled rib T7 extended rotated and side bent left L2 flexed rotated and side bent right L3 flexed rotated and side bent left Sacrum right on right    Assessment and Plan:  Slipped rib syndrome Suburban syndrome still noted.  Still doing better though with increasing activities, discussed icing regimen.  Will continue to work on Print production planner.  Follow-up with me again in 6 to 8 weeks otherwise.    Nonallopathic problems  Decision today to treat with OMT was based on Physical Exam  After verbal consent patient was treated with HVLA, ME, FPR techniques in cervical, rib, thoracic, lumbar, and sacral  areas  Patient tolerated the procedure well with improvement in symptoms  Patient given exercises,  stretches and lifestyle modifications  See medications in patient instructions if given  Patient will follow up in 4-8 weeks    The above documentation has been reviewed and is accurate and complete Allison Koenigs M [name], DO          Note: This dictation was prepared with Dragon dictation along with smaller phrase technology. Any transcriptional errors that result from this  process are unintentional.

## 2024-08-30 ENCOUNTER — Encounter: Payer: Self-pay | Admitting: Family Medicine

## 2024-08-30 ENCOUNTER — Ambulatory Visit: Admitting: Family Medicine

## 2024-08-30 VITALS — BP 120/84 | HR 89 | Ht 66.0 in | Wt 139.0 lb

## 2024-08-30 DIAGNOSIS — M9908 Segmental and somatic dysfunction of rib cage: Secondary | ICD-10-CM | POA: Diagnosis not present

## 2024-08-30 DIAGNOSIS — M940 Chondrocostal junction syndrome [Tietze]: Secondary | ICD-10-CM | POA: Diagnosis not present

## 2024-08-30 DIAGNOSIS — M9901 Segmental and somatic dysfunction of cervical region: Secondary | ICD-10-CM

## 2024-08-30 DIAGNOSIS — M9904 Segmental and somatic dysfunction of sacral region: Secondary | ICD-10-CM | POA: Diagnosis not present

## 2024-08-30 DIAGNOSIS — M9903 Segmental and somatic dysfunction of lumbar region: Secondary | ICD-10-CM

## 2024-08-30 DIAGNOSIS — M9902 Segmental and somatic dysfunction of thoracic region: Secondary | ICD-10-CM

## 2024-08-30 NOTE — Patient Instructions (Signed)
 See me again in 2 months Great to see you Have a great cruise

## 2024-08-30 NOTE — Assessment & Plan Note (Signed)
 Suburban syndrome still noted.  Still doing better though with increasing activities, discussed icing regimen.  Will continue to work on Print production planner.  Follow-up with me again in 6 to 8 weeks otherwise.

## 2024-10-17 NOTE — Progress Notes (Unsigned)
 [facility] [address] [place] [address] Phone: [phone] Subjective:   [name], am serving as a scribe for Dr. Arthea Claudene.  I'm seeing this patient by the request  of:  Alton Sor, PA-C  CC: Back and neck pain follow-up  YEP:Dlagzrupcz  Zaidee Rion is a 38 y.o. female coming in with complaint of back and neck pain. OMT 08/30/2024. Patient states same per usual. 2-3 weeks ago was doing a curtsey lunge step down and having some L hip/groin discomfort that has gotten better, but can still feel.  Medications patient has been prescribed: None  Taking:         Reviewed prior external information including notes and imaging from previsou exam, outside providers and external EMR if available.   As well as notes that were available from care everywhere and other healthcare systems.  Past medical history, social, surgical and family history all reviewed in electronic medical record.  No pertanent information unless stated regarding to the chief complaint.   Past Medical History:  Diagnosis Date   Chicken pox    Heart murmur    History of lumbar compression fracture 06/27/2018   Compression fracture L2-L3 Dexa done 07/05/17 report in chart CT lumbar report from 04/22/17 in chart as well as xray from 04/19/17   Lactose intolerance 07/11/2018   Age 12   Osteoporosis, followed by Dr. Faythe, thought to be due to Accutane 07/27/2017   Done density done 07/05/18 report in chart.    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 POSITIVE muscle aches  Objective  Blood pressure 108/64, pulse 80, height 5' 6 (1.676 m), weight 139 lb (63 kg), SpO2 99%.   General: No apparent distress alert and oriented x3 mood and affect normal, dressed appropriately.  HEENT: Pupils equal, extraocular movements intact  Respiratory: Patient's speak in full sentences and does not appear short of breath  Cardiovascular: No lower extremity edema, non tender, no erythema  Gait MSK:  Back does have some limited range of motion in certain planes.  Seems to be more over the sacroiliac joint.  Limited internal range of motion of the hip.  Negative fulcrum test.  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  Colchicine given.  Warned of potential side effects.  Hopeful that this will make a difference.  No sign of any type of stress reaction today.  Questionable although labral pathology of the hip is within the differential.  Follow-up again in 6 to 12 weeks  SI (sacroiliac) joint dysfunction Chronic problem with some exacerbation.  Discussed icing regimen and home exercises, discussed increasing activity  slowly.  Follow-up again in 6 to 12 weeks    Nonallopathic problems  Decision today to treat with OMT was based on Physical Exam  After verbal consent patient was treated with HVLA, ME, FPR techniques in cervical, rib, thoracic, lumbar, and sacral  areas  Patient tolerated the procedure well with improvement in symptoms  Patient given exercises, stretches and lifestyle modifications  See medications in  patient instructions if given  Patient will follow up in 4-8 weeks     The above documentation has been reviewed and is accurate and complete Shavone Nevers M Fany Cavanaugh, DO         Note: This dictation was prepared with Dragon dictation along with smaller phrase technology. Any transcriptional errors that result from this process are unintentional.

## 2024-10-18 ENCOUNTER — Ambulatory Visit: Admitting: Family Medicine

## 2024-10-18 ENCOUNTER — Encounter: Payer: Self-pay | Admitting: Family Medicine

## 2024-10-18 VITALS — BP 108/64 | HR 80 | Ht 66.0 in | Wt 139.0 lb

## 2024-10-18 DIAGNOSIS — M9901 Segmental and somatic dysfunction of cervical region: Secondary | ICD-10-CM

## 2024-10-18 DIAGNOSIS — M9903 Segmental and somatic dysfunction of lumbar region: Secondary | ICD-10-CM

## 2024-10-18 DIAGNOSIS — M898X8 Other specified disorders of bone, other site: Secondary | ICD-10-CM | POA: Insufficient documentation

## 2024-10-18 DIAGNOSIS — M9904 Segmental and somatic dysfunction of sacral region: Secondary | ICD-10-CM

## 2024-10-18 DIAGNOSIS — M9902 Segmental and somatic dysfunction of thoracic region: Secondary | ICD-10-CM

## 2024-10-18 DIAGNOSIS — M9908 Segmental and somatic dysfunction of rib cage: Secondary | ICD-10-CM

## 2024-10-18 DIAGNOSIS — M533 Sacrococcygeal disorders, not elsewhere classified: Secondary | ICD-10-CM

## 2024-10-18 MED ORDER — COLCHICINE 0.6 MG PO TABS: 0.6000 mg | ORAL_TABLET | Freq: Two times a day (BID) | ORAL | 0 refills | Status: AC

## 2024-10-18 NOTE — Assessment & Plan Note (Signed)
 Chronic problem with some exacerbation.  Discussed icing regimen and home exercises, discussed increasing activity slowly.  Follow-up again in 6 to 12 weeks

## 2024-10-18 NOTE — Assessment & Plan Note (Signed)
 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  Colchicine given.  Warned of potential side effects.  Hopeful that this will make a difference.  No sign of any type of stress reaction today.  Questionable although labral pathology of the hip is within the differential.  Follow-up again in 6 to 12 weeks

## 2024-10-18 NOTE — Patient Instructions (Addendum)
 Prescription sent Get rid of curtsey See you again in 6-8 weeks

## 2024-10-30 ENCOUNTER — Encounter: Payer: Self-pay | Admitting: Family Medicine

## 2024-11-06 NOTE — Progress Notes (Signed)
 " [facility] [address] [place] [address] Phone: [phone] Subjective:   [name], am serving as a scribe for Dr. Arthea Claudene.  I'm seeing this patient by the request  of:  Alton Sor, PA-C  CC: back and neck pain follow up   YEP:Dlagzrupcz  Allison [name] is a 38 y.o. female coming in with complaint of back and neck pain. OMT 10/18/2024. Patient states same per usual. Still having trouble with L hip and gluteal.  Medications patient has been prescribed: colchicine   Taking:      Reviewed prior external information including notes and imaging from previsou exam, outside providers and external EMR if available.   As well as notes that were available from care everywhere and other healthcare systems.  Past medical history, social, surgical and family history all reviewed in electronic medical record.  No pertanent information unless stated regarding to the chief complaint.   Past Medical History:  Diagnosis Date   Chicken pox    Heart murmur    History of lumbar compression fracture 06/27/2018   Compression fracture L2-L3 Dexa done 07/05/17 report in chart CT lumbar report from 04/22/17 in chart as well as xray from 04/19/17   Lactose intolerance 07/11/2018   Age 40   Osteoporosis, followed by Dr. Faythe, thought to be due to Accutane 07/27/2017   Done density done 07/05/18 report in chart.    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 POSITIVE muscle aches  Objective  Blood pressure 110/76, pulse 83, height 5' 6  (1.676 m), weight 140 lb (63.5 kg), SpO2 99%.   General: No apparent distress alert and oriented x3 mood and affect normal, dressed appropriately.  HEENT: Pupils equal, extraocular movements intact  Respiratory: Patient's speak in full sentences and does not appear short of breath  Cardiovascular: No lower extremity edema, non tender, no erythema  Gait MSK:  Back does have some loss of lordosis.  More tightness noted over the left sacroiliac joint.  Tightness noted around the left pubic bone as well.  Still some mild swelling over the area.  Osteopathic findings  C2 flexed rotated and side bent right C6 flexed rotated and side bent left T3 extended rotated and side bent right inhaled rib T9 extended rotated and side bent left L2 flexed rotated and side bent right Sacrum left on left     Assessment and Plan:  SI (sacroiliac) joint dysfunction Chronic, with exacerbation, we have injected her sacroiliac joints previously.  Responds better though to osteopathic manipulation.  Discussed icing regimen and home exercises, discussed which activities to do and which ones to avoid.  Increase activity slowly.  Discussed icing regimen.  Follow-up again in 6 to 12 weeks prednisone  given with it being the holiday coming up.    Nonallopathic problems  Decision today to treat with OMT was based on Physical Exam  After verbal consent patient was treated with HVLA, ME, FPR techniques in cervical, rib, thoracic, lumbar, and sacral  areas  Patient tolerated the procedure well with improvement in symptoms  Patient given exercises, stretches and lifestyle modifications  See medications  in patient instructions if given  Patient will follow up in 4-8 weeks    The above documentation has been reviewed and is accurate and complete Oliver Heitzenrater M Ashwika Freels, DO          Note: This dictation was prepared with Dragon dictation along with smaller phrase technology. Any transcriptional errors that result  from this process are unintentional.            [1]  Allergies Allergen Reactions   Morphine Rash    Age 52 said felt like skin burning tolerated hydrocodone in past    "

## 2024-11-08 ENCOUNTER — Ambulatory Visit: Admitting: Family Medicine

## 2024-11-08 ENCOUNTER — Encounter: Payer: Self-pay | Admitting: Family Medicine

## 2024-11-08 VITALS — BP 110/76 | HR 83 | Ht 66.0 in | Wt 140.0 lb

## 2024-11-08 DIAGNOSIS — M9901 Segmental and somatic dysfunction of cervical region: Secondary | ICD-10-CM

## 2024-11-08 DIAGNOSIS — M9902 Segmental and somatic dysfunction of thoracic region: Secondary | ICD-10-CM

## 2024-11-08 DIAGNOSIS — M9903 Segmental and somatic dysfunction of lumbar region: Secondary | ICD-10-CM | POA: Diagnosis not present

## 2024-11-08 DIAGNOSIS — M9904 Segmental and somatic dysfunction of sacral region: Secondary | ICD-10-CM | POA: Diagnosis not present

## 2024-11-08 DIAGNOSIS — M9908 Segmental and somatic dysfunction of rib cage: Secondary | ICD-10-CM

## 2024-11-08 DIAGNOSIS — M533 Sacrococcygeal disorders, not elsewhere classified: Secondary | ICD-10-CM

## 2024-11-08 MED ORDER — PREDNISONE 50 MG PO TABS: 50.0000 mg | ORAL_TABLET | Freq: Every day | ORAL | 0 refills | Status: AC

## 2024-11-08 NOTE — Assessment & Plan Note (Signed)
 Chronic, with exacerbation, we have injected her sacroiliac joints previously.  Responds better though to osteopathic manipulation.  Discussed icing regimen and home exercises, discussed which activities to do and which ones to avoid.  Increase activity slowly.  Discussed icing regimen.  Follow-up again in 6 to 12 weeks prednisone  given with it being the holiday coming up.

## 2024-11-08 NOTE — Patient Instructions (Signed)
Good to see you!!    Happy Holidays!

## 2024-12-06 NOTE — Progress Notes (Unsigned)
 " [name] [address] [place] [address] Phone: [phone] Subjective:   Allison [name], am serving as a scribe for Dr. Arthea [name].  I'm seeing this patient by the request  of:  Alton Sor, PA-C  CC: Back and neck pain follow-up  YEP:Dlagzrupcz  Allison [name] is a 39 y.o. female coming in with complaint of back and neck pain. OMT 11/08/2024.  At last follow-up continuing to have more left hip and gluteal pain.  Patient states that her L hip is better but not back to baseline. Pain moves around hip.   One week ago she woke up with stiff neck. Still having stiffness.   Medications patient has been prescribed: Colchicine , prednisone   Taking:         Reviewed prior external information including notes and imaging from previsou exam, outside providers and external EMR if available.   As well as notes that were available from care everywhere and other healthcare systems.  Past medical history, social, surgical and family history all reviewed in electronic medical record.  No pertanent information unless stated regarding to the chief complaint.   Past Medical History:  Diagnosis Date   Chicken pox    Heart murmur    History of lumbar compression fracture 06/27/2018   Compression fracture L2-L3 Dexa done 07/05/17 report in chart CT lumbar report from 04/22/17 in chart as well as xray from 04/19/17   Lactose intolerance 07/11/2018   Age 82   Osteoporosis, followed by Dr. Faythe, thought to be due to Accutane 07/27/2017   Done density done 07/05/18 report in chart.    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 POSITIVE muscle aches  Objective  Blood pressure 110/72, pulse 77, height 5' 6 (1.676 m), weight 134 lb (60.8 kg), SpO2 97%.   General: No apparent distress alert and oriented x3 mood and affect normal, dressed appropriately.  HEENT: Pupils equal, extraocular movements intact  Respiratory: Patient's speak in full sentences and does not appear short of breath  Cardiovascular: No lower extremity edema, non tender, no erythema  Gait relatively normal MSK:  Back does have some loss lordosis noted.  Some tenderness to palpation more over the sacroiliac joint left greater than right.  Patient does have tightness in the neck also noted.  Likely no significant groin pain today.  Osteopathic findings  C2 flexed rotated and side bent right C5 flexed rotated and side bent left T3 extended rotated and side bent right inhaled rib T9 extended rotated and side bent left L2 flexed rotated and side bent right L3 flexed rotated and side bent left Sacrum left on left     Assessment and Plan:  SI (sacroiliac) joint dysfunction Continue to monitor, patient has done a very good job with core strengthening.  I do believe that patient will continue to do well.  Discussed icing regimen and home exercises, increase activity slowly.  Worsening pain could consider the prednisone  which I do think is beneficial and patient has it.  Follow-up with me again in 6 to 8 weeks    Nonallopathic problems  Decision today to  treat with OMT was based on Physical Exam  After verbal consent patient was treated with HVLA, ME, FPR techniques in cervical, rib, thoracic, lumbar, and sacral  areas  Patient tolerated the procedure well with improvement in symptoms  Patient given exercises, stretches and lifestyle modifications  See medications in patient instructions if given  Patient will follow up in 4-8 weeks      The above  documentation has been reviewed and is accurate and complete Allison [name] M Allison Sees, DO        Note: This dictation was prepared with Dragon dictation along with smaller phrase technology. Any transcriptional errors that result from this process are unintentional.            [1]  Allergies Allergen Reactions   Morphine Rash    Age 74 said felt like skin burning tolerated hydrocodone in past    "

## 2024-12-07 ENCOUNTER — Ambulatory Visit: Admitting: Family Medicine

## 2024-12-07 ENCOUNTER — Encounter: Payer: Self-pay | Admitting: Family Medicine

## 2024-12-07 VITALS — BP 110/72 | HR 77 | Ht 66.0 in | Wt 134.0 lb

## 2024-12-07 DIAGNOSIS — M9903 Segmental and somatic dysfunction of lumbar region: Secondary | ICD-10-CM | POA: Diagnosis not present

## 2024-12-07 DIAGNOSIS — M533 Sacrococcygeal disorders, not elsewhere classified: Secondary | ICD-10-CM

## 2024-12-07 DIAGNOSIS — M9904 Segmental and somatic dysfunction of sacral region: Secondary | ICD-10-CM

## 2024-12-07 DIAGNOSIS — M9908 Segmental and somatic dysfunction of rib cage: Secondary | ICD-10-CM

## 2024-12-07 DIAGNOSIS — M9901 Segmental and somatic dysfunction of cervical region: Secondary | ICD-10-CM | POA: Diagnosis not present

## 2024-12-07 DIAGNOSIS — M9902 Segmental and somatic dysfunction of thoracic region: Secondary | ICD-10-CM

## 2024-12-07 NOTE — Patient Instructions (Signed)
See me again in 6 weeks 

## 2024-12-07 NOTE — Assessment & Plan Note (Signed)
 Continue to monitor, patient has done a very good job with core strengthening.  I do believe that patient will continue to do well.  Discussed icing regimen and home exercises, increase activity slowly.  Worsening pain could consider the prednisone  which I do think is beneficial and patient has it.  Follow-up with me again in 6 to 8 weeks

## 2025-01-19 ENCOUNTER — Ambulatory Visit: Admitting: Family Medicine

## 2025-01-24 ENCOUNTER — Ambulatory Visit: Admitting: Family Medicine

## 2025-03-13 ENCOUNTER — Ambulatory Visit: Admitting: Family Medicine
